# Patient Record
Sex: Male | Born: 1965 | Race: White | Hispanic: No | Marital: Single | Smoking: Current every day smoker
Health system: Southern US, Community
[De-identification: ages and names within clinical notes are randomized; demographics above are authoritative.]

## PROBLEM LIST (undated history)

## (undated) DIAGNOSIS — K759 Inflammatory liver disease, unspecified: Secondary | ICD-10-CM

## (undated) DIAGNOSIS — I214 Non-ST elevation (NSTEMI) myocardial infarction: Secondary | ICD-10-CM

## (undated) DIAGNOSIS — I509 Heart failure, unspecified: Secondary | ICD-10-CM

## (undated) DIAGNOSIS — I219 Acute myocardial infarction, unspecified: Secondary | ICD-10-CM

## (undated) DIAGNOSIS — I10 Essential (primary) hypertension: Secondary | ICD-10-CM

## (undated) DIAGNOSIS — C801 Malignant (primary) neoplasm, unspecified: Secondary | ICD-10-CM

## (undated) DIAGNOSIS — K2270 Barrett's esophagus without dysplasia: Secondary | ICD-10-CM

## (undated) DIAGNOSIS — J449 Chronic obstructive pulmonary disease, unspecified: Secondary | ICD-10-CM

## (undated) DIAGNOSIS — I2510 Atherosclerotic heart disease of native coronary artery without angina pectoris: Secondary | ICD-10-CM

## (undated) HISTORY — PX: LIVER BIOPSY: SHX301

## (undated) HISTORY — PX: CARDIAC CATHETERIZATION: SHX172

## (undated) HISTORY — PX: CORONARY STENT INTERVENTION: CATH118234

## (undated) HISTORY — PX: APPENDECTOMY: SHX54

## (undated) HISTORY — PX: CORONARY STENT PLACEMENT: SHX1402

---

## 2003-01-10 ENCOUNTER — Encounter: Payer: Self-pay | Admitting: Emergency Medicine

## 2003-01-10 ENCOUNTER — Emergency Department (HOSPITAL_COMMUNITY): Admission: EM | Admit: 2003-01-10 | Discharge: 2003-01-10 | Payer: Self-pay | Admitting: Emergency Medicine

## 2004-12-04 ENCOUNTER — Emergency Department (HOSPITAL_COMMUNITY): Admission: EM | Admit: 2004-12-04 | Discharge: 2004-12-04 | Payer: Self-pay | Admitting: Emergency Medicine

## 2010-07-12 ENCOUNTER — Ambulatory Visit: Payer: Self-pay | Admitting: Psychiatry

## 2010-07-13 ENCOUNTER — Inpatient Hospital Stay (HOSPITAL_COMMUNITY): Admission: EM | Admit: 2010-07-13 | Discharge: 2010-07-15 | Payer: Self-pay | Admitting: Psychiatry

## 2010-08-16 ENCOUNTER — Encounter
Admission: RE | Admit: 2010-08-16 | Discharge: 2010-09-12 | Payer: Self-pay | Source: Home / Self Care | Attending: Otolaryngology | Admitting: Otolaryngology

## 2010-11-28 LAB — FOLATE RBC: RBC Folate: 668 ng/mL — ABNORMAL HIGH (ref 180–600)

## 2010-11-28 LAB — TSH: TSH: 5.527 u[IU]/mL — ABNORMAL HIGH (ref 0.350–4.500)

## 2012-09-29 ENCOUNTER — Emergency Department (HOSPITAL_COMMUNITY): Payer: Medicaid Other

## 2012-09-29 ENCOUNTER — Inpatient Hospital Stay (HOSPITAL_COMMUNITY)
Admission: EM | Admit: 2012-09-29 | Discharge: 2012-10-04 | DRG: 194 | Disposition: A | Discharge status: Home / Self Care | Payer: Medicaid Other | Attending: Internal Medicine | Admitting: Internal Medicine

## 2012-09-29 ENCOUNTER — Encounter (HOSPITAL_COMMUNITY): Payer: Self-pay | Admitting: Emergency Medicine

## 2012-09-29 DIAGNOSIS — J449 Chronic obstructive pulmonary disease, unspecified: Secondary | ICD-10-CM

## 2012-09-29 DIAGNOSIS — I428 Other cardiomyopathies: Secondary | ICD-10-CM

## 2012-09-29 DIAGNOSIS — R000 Tachycardia, unspecified: Secondary | ICD-10-CM | POA: Diagnosis present

## 2012-09-29 DIAGNOSIS — J111 Influenza due to unidentified influenza virus with other respiratory manifestations: Secondary | ICD-10-CM

## 2012-09-29 DIAGNOSIS — R0602 Shortness of breath: Secondary | ICD-10-CM

## 2012-09-29 DIAGNOSIS — J4489 Other specified chronic obstructive pulmonary disease: Secondary | ICD-10-CM | POA: Diagnosis present

## 2012-09-29 DIAGNOSIS — E860 Dehydration: Secondary | ICD-10-CM

## 2012-09-29 DIAGNOSIS — Z8619 Personal history of other infectious and parasitic diseases: Secondary | ICD-10-CM | POA: Diagnosis present

## 2012-09-29 DIAGNOSIS — D751 Secondary polycythemia: Secondary | ICD-10-CM | POA: Diagnosis present

## 2012-09-29 DIAGNOSIS — R509 Fever, unspecified: Secondary | ICD-10-CM

## 2012-09-29 DIAGNOSIS — I429 Cardiomyopathy, unspecified: Secondary | ICD-10-CM

## 2012-09-29 DIAGNOSIS — J101 Influenza due to other identified influenza virus with other respiratory manifestations: Principal | ICD-10-CM

## 2012-09-29 DIAGNOSIS — R079 Chest pain, unspecified: Secondary | ICD-10-CM

## 2012-09-29 DIAGNOSIS — D45 Polycythemia vera: Secondary | ICD-10-CM | POA: Diagnosis present

## 2012-09-29 DIAGNOSIS — B1920 Unspecified viral hepatitis C without hepatic coma: Secondary | ICD-10-CM | POA: Diagnosis present

## 2012-09-29 DIAGNOSIS — I498 Other specified cardiac arrhythmias: Secondary | ICD-10-CM | POA: Diagnosis present

## 2012-09-29 DIAGNOSIS — Z87891 Personal history of nicotine dependence: Secondary | ICD-10-CM

## 2012-09-29 HISTORY — DX: Chronic obstructive pulmonary disease, unspecified: J44.9

## 2012-09-29 MED ORDER — SODIUM CHLORIDE 0.9 % IV BOLUS (SEPSIS)
1000.0000 mL | Freq: Once | INTRAVENOUS | Status: AC
  Administered 2012-09-30: 1000 mL via INTRAVENOUS

## 2012-09-29 MED ORDER — ACETAMINOPHEN 325 MG PO TABS
650.0000 mg | ORAL_TABLET | Freq: Once | ORAL | Status: AC
  Administered 2012-09-30: 650 mg via ORAL
  Filled 2012-09-29: qty 2

## 2012-09-29 NOTE — ED Notes (Signed)
Bed:WA04<BR> Expected date:<BR> Expected time:<BR> Means of arrival:<BR> Comments:<BR> Hold for triage

## 2012-09-29 NOTE — ED Notes (Signed)
Pt alert, arrives from home, c/o cough, sob, fever, onset was last weekend, resp even unlabored, skin pwd

## 2012-09-30 ENCOUNTER — Inpatient Hospital Stay (HOSPITAL_COMMUNITY): Payer: Medicaid Other

## 2012-09-30 ENCOUNTER — Encounter (HOSPITAL_COMMUNITY): Payer: Self-pay | Admitting: Internal Medicine

## 2012-09-30 DIAGNOSIS — Z8619 Personal history of other infectious and parasitic diseases: Secondary | ICD-10-CM | POA: Diagnosis present

## 2012-09-30 DIAGNOSIS — R509 Fever, unspecified: Secondary | ICD-10-CM | POA: Diagnosis present

## 2012-09-30 DIAGNOSIS — J111 Influenza due to unidentified influenza virus with other respiratory manifestations: Secondary | ICD-10-CM

## 2012-09-30 DIAGNOSIS — J101 Influenza due to other identified influenza virus with other respiratory manifestations: Secondary | ICD-10-CM | POA: Diagnosis present

## 2012-09-30 DIAGNOSIS — I428 Other cardiomyopathies: Secondary | ICD-10-CM

## 2012-09-30 DIAGNOSIS — E860 Dehydration: Secondary | ICD-10-CM

## 2012-09-30 DIAGNOSIS — I429 Cardiomyopathy, unspecified: Secondary | ICD-10-CM | POA: Diagnosis present

## 2012-09-30 DIAGNOSIS — R0602 Shortness of breath: Secondary | ICD-10-CM

## 2012-09-30 LAB — URINALYSIS, ROUTINE W REFLEX MICROSCOPIC
Hgb urine dipstick: NEGATIVE
Leukocytes, UA: NEGATIVE
Nitrite: NEGATIVE
Specific Gravity, Urine: 1.025 (ref 1.005–1.030)
Urobilinogen, UA: 1 mg/dL (ref 0.0–1.0)

## 2012-09-30 LAB — BASIC METABOLIC PANEL
BUN: 13 mg/dL (ref 6–23)
CO2: 23 mEq/L (ref 19–32)
Chloride: 100 mEq/L (ref 96–112)
Creatinine, Ser: 1.01 mg/dL (ref 0.50–1.35)
GFR calc Af Amer: >90 mL/min (ref >90)
Glucose, Bld: 115 mg/dL — ABNORMAL HIGH (ref 70–99)

## 2012-09-30 LAB — CBC WITH DIFFERENTIAL/PLATELET
Lymphocytes Relative: 18 % (ref 12–46)
Lymphs Abs: 1.3 10*3/uL (ref 0.7–4.0)
Neutro Abs: 4.4 10*3/uL (ref 1.7–7.7)
Neutrophils Relative %: 62 % (ref 43–77)
Platelets: 205 10*3/uL (ref 150–400)
RBC: 5.38 MIL/uL (ref 4.22–5.81)
WBC: 7 10*3/uL (ref 4.0–10.5)

## 2012-09-30 LAB — COMPREHENSIVE METABOLIC PANEL
AST: 26 U/L (ref 0–37)
CO2: 24 mEq/L (ref 19–32)
Calcium: 9.3 mg/dL (ref 8.4–10.5)
Creatinine, Ser: 1.07 mg/dL (ref 0.50–1.35)
GFR calc non Af Amer: 81 mL/min — ABNORMAL LOW (ref >90)

## 2012-09-30 LAB — CBC
HCT: 46.8 % (ref 39.0–52.0)
MCH: 33.3 pg (ref 26.0–34.0)
MCHC: 34.4 g/dL (ref 30.0–36.0)
MCV: 96.9 fL (ref 78.0–100.0)
RDW: 13.6 % (ref 11.5–15.5)

## 2012-09-30 LAB — D-DIMER, QUANTITATIVE: D-Dimer, Quant: 0.98 ug/mL-FEU — ABNORMAL HIGH (ref 0.00–0.48)

## 2012-09-30 LAB — PRO B NATRIURETIC PEPTIDE: Pro B Natriuretic peptide (BNP): 478.1 pg/mL — ABNORMAL HIGH (ref 0–125)

## 2012-09-30 LAB — INFLUENZA PANEL BY PCR (TYPE A & B)
Influenza A By PCR: POSITIVE — AB
Influenza B By PCR: NEGATIVE

## 2012-09-30 LAB — TROPONIN I
Troponin I: <0.3 ng/mL (ref <0.30)
Troponin I: <0.3 ng/mL (ref <0.30)

## 2012-09-30 LAB — PROCALCITONIN: Procalcitonin: <0.1 ng/mL

## 2012-09-30 MED ORDER — SODIUM CHLORIDE 0.9 % IJ SOLN
3.0000 mL | Freq: Two times a day (BID) | INTRAMUSCULAR | Status: DC
  Administered 2012-10-01 – 2012-10-03 (×6): 3 mL via INTRAVENOUS

## 2012-09-30 MED ORDER — IBUPROFEN 800 MG PO TABS
800.0000 mg | ORAL_TABLET | Freq: Once | ORAL | Status: AC
  Administered 2012-09-30: 800 mg via ORAL
  Filled 2012-09-30: qty 1

## 2012-09-30 MED ORDER — ACETAMINOPHEN 325 MG PO TABS
650.0000 mg | ORAL_TABLET | Freq: Four times a day (QID) | ORAL | Status: DC | PRN
  Administered 2012-09-30 – 2012-10-01 (×2): 650 mg via ORAL
  Filled 2012-09-30 (×2): qty 2

## 2012-09-30 MED ORDER — ENOXAPARIN SODIUM 40 MG/0.4ML ~~LOC~~ SOLN
40.0000 mg | SUBCUTANEOUS | Status: DC
  Administered 2012-09-30 – 2012-10-03 (×4): 40 mg via SUBCUTANEOUS
  Filled 2012-09-30 (×5): qty 0.4

## 2012-09-30 MED ORDER — NITROGLYCERIN 0.4 MG SL SUBL: 0.4000 mg | SUBLINGUAL_TABLET | SUBLINGUAL | Status: DC | PRN

## 2012-09-30 MED ORDER — ACETAMINOPHEN 650 MG RE SUPP: 650.0000 mg | Freq: Four times a day (QID) | RECTAL | Status: DC | PRN

## 2012-09-30 MED ORDER — INFLUENZA VIRUS VACC SPLIT PF IM SUSP
0.5000 mL | Freq: Once | INTRAMUSCULAR | Status: AC
  Administered 2012-09-30: 0.5 mL via INTRAMUSCULAR
  Filled 2012-09-30: qty 0.5

## 2012-09-30 MED ORDER — SODIUM CHLORIDE 0.9 % IV SOLN
INTRAVENOUS | Status: AC
  Administered 2012-09-30 (×3): via INTRAVENOUS

## 2012-09-30 MED ORDER — ONDANSETRON HCL 4 MG PO TABS: 4.0000 mg | ORAL_TABLET | Freq: Four times a day (QID) | ORAL | Status: DC | PRN

## 2012-09-30 MED ORDER — SODIUM CHLORIDE 0.9 % IV SOLN
Freq: Once | INTRAVENOUS | Status: AC
  Administered 2012-09-30: 01:00:00 via INTRAVENOUS

## 2012-09-30 MED ORDER — LEVOFLOXACIN IN D5W 750 MG/150ML IV SOLN
750.0000 mg | Freq: Every day | INTRAVENOUS | Status: DC
  Administered 2012-09-30 – 2012-10-01 (×2): 750 mg via INTRAVENOUS
  Filled 2012-09-30 (×2): qty 150

## 2012-09-30 MED ORDER — OSELTAMIVIR PHOSPHATE 75 MG PO CAPS
75.0000 mg | ORAL_CAPSULE | Freq: Two times a day (BID) | ORAL | Status: AC
  Administered 2012-09-30 – 2012-10-04 (×10): 75 mg via ORAL
  Filled 2012-09-30 (×12): qty 1

## 2012-09-30 MED ORDER — IOHEXOL 350 MG/ML SOLN
100.0000 mL | Freq: Once | INTRAVENOUS | Status: AC | PRN
  Administered 2012-09-30: 100 mL via INTRAVENOUS

## 2012-09-30 MED ORDER — CARVEDILOL 3.125 MG PO TABS
3.1250 mg | ORAL_TABLET | Freq: Two times a day (BID) | ORAL | Status: DC
  Administered 2012-09-30 – 2012-10-04 (×8): 3.125 mg via ORAL
  Filled 2012-09-30 (×11): qty 1

## 2012-09-30 MED ORDER — ASPIRIN EC 81 MG PO TBEC
81.0000 mg | DELAYED_RELEASE_TABLET | Freq: Every day | ORAL | Status: DC
  Administered 2012-09-30 – 2012-10-04 (×5): 81 mg via ORAL
  Filled 2012-09-30 (×5): qty 1

## 2012-09-30 MED ORDER — ONDANSETRON HCL 4 MG/2ML IJ SOLN: 4.0000 mg | Freq: Four times a day (QID) | INTRAMUSCULAR | Status: DC | PRN

## 2012-09-30 MED ORDER — PNEUMOCOCCAL VAC POLYVALENT 25 MCG/0.5ML IJ INJ
0.5000 mL | INJECTION | Freq: Once | INTRAMUSCULAR | Status: AC
  Administered 2012-09-30: 0.5 mL via INTRAMUSCULAR
  Filled 2012-09-30: qty 0.5

## 2012-09-30 NOTE — Progress Notes (Signed)
INITIAL NUTRITION ASSESSMENT  DOCUMENTATION CODES Per approved criteria  -Not Applicable   INTERVENTION: 1.  Meals/snacks; as requested from nourishment room. 2.  Modify diet; pt may benefit from diet liberalization to Regular if appropriate per MD.    NUTRITION DIAGNOSIS: Inadequate oral intake related to weakness, poor appetite as evidenced by pt report.   Monitor:  1.  Food/Beverage; improvement in intake to >50% of meals.  Reason for Assessment: MST  47 y.o. male  Admitting Dx: SOB (shortness of breath)  ASSESSMENT: Pt admitted with fever and shortness of breath found to have flu.  Pt with h/o cardiomyopathy. Pt states that over the past month he has noted wt change of unknown amount.  States his clothes were fitting differently.  Pt reports he has been working a lot and typically gets a ham and egg biscuit on his way to work every day.  He typically does not eat anything else because he is a poor cook and cannot keep snacks at home.   Pt states he has not been able to eat anything in the past 3-4 days due to illness.  Was able to eat 25% of meal this afternoon- broth and few bites of chicken.  Agreeable to snack when offered. RD discussed inadequacy of intake at home PTA. Pt states agreement that he has not been eating enough. RD discussed need to keep easy to prepare meals or snacks at home due to pt's lack of cooking.  Will follow for ongoing education needs.  Height: Ht Readings from Last 1 Encounters:  09/30/12 5\' 9"  (1.753 m)    Weight: Wt Readings from Last 1 Encounters:  09/30/12 135 lb 2.3 oz (61.3 kg)    Ideal Body Weight: 72.7  % Ideal Body Weight: 84%  Wt Readings from Last 10 Encounters:  09/30/12 135 lb 2.3 oz (61.3 kg)    Usual Body Weight: pt does not know  BMI:  Body mass index is 19.96 kg/(m^2).  Estimated Nutritional Needs: Kcal: 1700-1850 Protein: 61-73g Fluid: >1.8 L/day  Skin: wnl  Diet Order: Cardiac  EDUCATION NEEDS: -Education  needs addressed   Intake/Output Summary (Last 24 hours) at 09/30/12 1606 Last data filed at 09/30/12 1500  Gross per 24 hour  Intake   1395 ml  Output    300 ml  Net   1095 ml    Last BM: PTA   Labs:   Lab 09/30/12 0530 09/30/12 0003  NA 133* 132*  K 3.5 3.7  CL 100 94*  CO2 23 24  BUN 13 11  CREATININE 1.01 1.07  CALCIUM 8.4 9.3  MG -- --  PHOS -- --  GLUCOSE 115* 89    CBG (last 3)  No results found for this basename: GLUCAP:3 in the last 72 hours  Scheduled Meds:   . aspirin EC  81 mg Oral Daily  . carvedilol  3.125 mg Oral BID WC  . enoxaparin (LOVENOX) injection  40 mg Subcutaneous Q24H  . levofloxacin (LEVAQUIN) IV  750 mg Intravenous Q0600  . oseltamivir  75 mg Oral BID  . sodium chloride  3 mL Intravenous Q12H    Continuous Infusions:   . sodium chloride 100 mL/hr at 09/30/12 1232    Past Medical History  Diagnosis Date  . COPD (chronic obstructive pulmonary disease)     Past Surgical History  Procedure Date  . Appendectomy   . Liver biopsy   . Cardiac catheterization     Loyce Dys, MS RD LDN Clinical  Inpatient Dietitian Pager: [phone] Weekend/After hours pager: [phone]

## 2012-09-30 NOTE — ED Notes (Signed)
Went into room to check on patient. Patient was sweating and hot to touch. Took rectal temp on patient 101.8. Reported findings to [employer]. Patient is on the cardiac monitor and VS are cycling.

## 2012-09-30 NOTE — ED Notes (Signed)
Pt transported to floor via stretcher on monitor condition remains stable

## 2012-09-30 NOTE — H&P (Signed)
Christopher Meyers is an 47 y.o. male.   Patient was seen and examined on September 30, 2012. PCP - none. Chief Complaint: Fever and shortness of breath. HPI: 47 year old male with history of nonischemic cardiomyopathy has had cardiac catheter last year April in [facility] which was showing normal coronaries presents with complaints of worsening shortness of breath fever chills nausea vomiting and fatigue. Patient has been having these symptoms for last 3-4 days. Patient has been having productive cough for the same number of days. In the ER patient was found to be tachycardic and febrile. Chest x-ray does not show any acute UA is unremarkable. Patient at this time has been admitted for fever most likely from viral causes.   Past Medical History  Diagnosis Date  . COPD (chronic obstructive pulmonary disease)     Past Surgical History  Procedure Date  . Appendectomy   . Liver biopsy   . Cardiac catheterization     Family History  Problem Relation Age of Onset  . CAD Father   . Asthma Sister    Social History:  reports that he has quit smoking. He does not have any smokeless tobacco history on file. He reports that he does not drink alcohol. His drug history not on file.  Allergies: No Known Allergies   (Not in a hospital admission)  Results for orders placed during the hospital encounter of 09/29/12 (from the past 48 hour(s))  CBC WITH DIFFERENTIAL     Status: Abnormal   Collection Time   09/30/12 12:03 AM      Component Value Range Comment   WBC 7.0  4.0 - 10.5 K/uL    RBC 5.38  4.22 - 5.81 MIL/uL    Hemoglobin 18.2 (*) 13.0 - 17.0 g/dL    HCT 16.1 (*) 09.6 - 52.0 %    MCV 97.0  78.0 - 100.0 fL    MCH 33.8  26.0 - 34.0 pg    MCHC 34.9  30.0 - 36.0 g/dL    RDW 04.5  40.9 - 81.1 %    Platelets 205  150 - 400 K/uL    Neutrophils Relative 62  43 - 77 %    Neutro Abs 4.4  1.7 - 7.7 K/uL    Lymphocytes Relative 18  12 - 46 %    Lymphs Abs 1.3  0.7 - 4.0 K/uL      Monocytes Relative 19 (*) 3 - 12 %    Monocytes Absolute 1.4 (*) 0.1 - 1.0 K/uL    Eosinophils Relative 0  0 - 5 %    Eosinophils Absolute 0.0  0.0 - 0.7 K/uL    Basophils Relative 0  0 - 1 %    Basophils Absolute 0.0  0.0 - 0.1 K/uL   TROPONIN I     Status: Normal   Collection Time   09/30/12 12:03 AM      Component Value Range Comment   Troponin I <0.30  <0.30 ng/mL   PRO B NATRIURETIC PEPTIDE     Status: Abnormal   Collection Time   09/30/12 12:03 AM      Component Value Range Comment   Pro B Natriuretic peptide (BNP) 478.1 (*) 0 - 125 pg/mL   COMPREHENSIVE METABOLIC PANEL     Status: Abnormal   Collection Time   09/30/12 12:03 AM      Component Value Range Comment   Sodium 132 (*) 135 - 145 mEq/L    Potassium 3.7  3.5 - 5.1 mEq/L    Chloride 94 (*) 96 - 112 mEq/L    CO2 24  19 - 32 mEq/L    Glucose, Bld 89  70 - 99 mg/dL    BUN 11  6 - 23 mg/dL    Creatinine, Ser 2.72  0.50 - 1.35 mg/dL    Calcium 9.3  8.4 - 53.6 mg/dL    Total Protein 7.9  6.0 - 8.3 g/dL    Albumin 3.8  3.5 - 5.2 g/dL    AST 26  0 - 37 U/L    ALT 10  0 - 53 U/L    Alkaline Phosphatase 61  39 - 117 U/L    Total Bilirubin 0.3  0.3 - 1.2 mg/dL    GFR calc non Af Amer 81 (*) >90 mL/min    GFR calc Af Amer >90  >90 mL/min   URINALYSIS, ROUTINE W REFLEX MICROSCOPIC     Status: Abnormal   Collection Time   09/30/12  1:16 AM      Component Value Range Comment   Color, Urine YELLOW  YELLOW    APPearance CLEAR  CLEAR    Specific Gravity, Urine 1.025  1.005 - 1.030    pH 5.5  5.0 - 8.0    Glucose, UA NEGATIVE  NEGATIVE mg/dL    Hgb urine dipstick NEGATIVE  NEGATIVE    Bilirubin Urine SMALL (*) NEGATIVE    Ketones, ur TRACE (*) NEGATIVE mg/dL    Protein, ur NEGATIVE  NEGATIVE mg/dL    Urobilinogen, UA 1.0  0.0 - 1.0 mg/dL    Nitrite NEGATIVE  NEGATIVE    Leukocytes, UA NEGATIVE  NEGATIVE MICROSCOPIC NOT DONE ON URINES WITH NEGATIVE PROTEIN, BLOOD, LEUKOCYTES, NITRITE, OR GLUCOSE <1000 mg/dL.   Dg Chest 2  View  09/30/2012  *RADIOLOGY REPORT*  Clinical Data: Chest pain, fever.  CHEST - 2 VIEW  Comparison: 05/20/2011  Findings: Hyperinflation.  Minimal spurring in the mid thoracic spine. Lungs clear.  Heart size and pulmonary vascularity normal. No effusion.  IMPRESSION: No acute disease   Original Report Authenticated By: D. Andria Rhein, MD     Review of Systems  Constitutional: Positive for fever, chills and malaise/fatigue.  HENT: Negative.   Eyes: Negative.   Respiratory: Positive for cough, sputum production and shortness of breath.   Cardiovascular: Negative.   Gastrointestinal: Positive for nausea and vomiting.  Genitourinary: Negative.   Musculoskeletal: Negative.   Skin: Negative.   Neurological: Positive for weakness.  Endo/Heme/Allergies: Negative.   Psychiatric/Behavioral: Negative.     Blood pressure 127/82, pulse 113, temperature 101.8 F (38.8 C), temperature source Rectal, resp. rate 23, weight 65.772 kg (145 lb), SpO2 97.00%. Physical Exam  Constitutional: He is oriented to person, place, and time. He appears well-developed and well-nourished. No distress.  HENT:  Head: Normocephalic and atraumatic.  Right Ear: External ear normal.  Left Ear: External ear normal.  Nose: Nose normal.  Mouth/Throat: Oropharynx is clear and moist. No oropharyngeal exudate.  Eyes: Conjunctivae normal are normal. Pupils are equal, round, and reactive to light. Right eye exhibits no discharge. Left eye exhibits no discharge. No scleral icterus.  Neck: Normal range of motion. Neck supple.  Cardiovascular: Regular rhythm.        Tachycardia.  Respiratory: Effort normal and breath sounds normal. No respiratory distress. He has no wheezes. He has no rales.  GI: Soft. Bowel sounds are normal. He exhibits no distension. There is no tenderness. There is no rebound.  Musculoskeletal: He exhibits no edema and no tenderness.  Neurological: He is alert and oriented to person, place, and time.        Moves all extremities.  Skin: Skin is warm and dry. He is not diaphoretic.     Assessment/Plan #1. Shortness of breath with fever nausea and vomiting - patient's symptoms are consistent with possible viral infection. Blood cultures procalcitonin levels and flu PCR has been ordered. Patient has been empirically started on Tamiflu and also at this time until cultures are available patient will be on Levaquin empirically for possible pneumonia. #2. Dehydration from nausea and vomiting - patient at this time will be placed on IV fluids. Patient does have history of cardiomyopathy but presently is dehydrated so we will gently hydrate. #3. History of nonischemic cardiomyopathy - patient was not taking his medications for last 2 months. Patient has been placed on Coreg again. Spironolactone is on hold due to the dehydration. #4. Polycythemia probably from dehydration and COPD - recheck CBC in a.m. #5. Recently quit smoking. 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  Zaakirah Kistner N. 09/30/2012, 2:54 AM

## 2012-09-30 NOTE — ED Notes (Signed)
Patient has been given urinal and is aware that we need a urine sample.

## 2012-09-30 NOTE — Progress Notes (Signed)
ANTIBIOTIC CONSULT NOTE - INITIAL  Pharmacy Consult for Levofloxacin Indication: pneumonia  No Known Allergies  Patient Measurements: Height: 5\' 9"  (175.3 cm) Weight: 135 lb 2.3 oz (61.3 kg) IBW/kg (Calculated) : 70.7  Adjusted Body Weight:   Vital Signs: Temp: 98 F (36.7 C) (01/15 0423) Temp src: Oral (01/15 0423) BP: 115/75 mmHg (01/15 0423) Pulse Rate: 87  (01/15 0423) Intake/Output from previous day: 01/14 0701 - 01/15 0700 In: -  Out: 300 [Urine:300] Intake/Output from this shift: Total I/O In: -  Out: 300 [Urine:300]  Labs:  [facility] 09/30/12 0003  WBC 7.0  HGB 18.2*  PLT 205  LABCREA --  CREATININE 1.07   Estimated Creatinine Clearance: 74 ml/min (by C-G formula based on Cr of 1.07). No results found for this basename: VANCOTROUGH:2,VANCOPEAK:2,VANCORANDOM:2,GENTTROUGH:2,GENTPEAK:2,GENTRANDOM:2,TOBRATROUGH:2,TOBRAPEAK:2,TOBRARND:2,AMIKACINPEAK:2,AMIKACINTROU:2,AMIKACIN:2, in the last 72 hours   Microbiology: No results found for this or any previous visit (from the past 720 hour(s)).  Medical History: Past Medical History  Diagnosis Date  . COPD (chronic obstructive pulmonary disease)     Medications:  Anti-infectives     Start     Dose/Rate Route Frequency Ordered Stop   09/30/12 0445   levofloxacin (LEVAQUIN) IVPB 750 mg        750 mg 100 mL/hr over 90 Minutes Intravenous Daily 09/30/12 0432     09/30/12 0200   oseltamivir (TAMIFLU) capsule 75 mg        75 mg Oral 2 times daily 09/30/12 0156 10/04/12 2159         Assessment: Patient with PNA.  Goal of Therapy:  Levofloxacin dosed based on patient weight and renal function  Plan:  Follow up culture results Levofloxacin 750mg  iv q24hr       Aleene Davidson Crowford 09/30/2012,4:33 AM

## 2012-09-30 NOTE — Care Management Note (Unsigned)
    Page 1 of 1   09/30/2012     11:12:46 AM   CARE MANAGEMENT NOTE 09/30/2012  Patient:  Christopher Meyers, Christopher Meyers   Account Number:  [id number]  Date Initiated:  09/30/2012  Documentation initiated by:  Lanier Clam  Subjective/Objective Assessment:   ADMITTED W/SOB.     Action/Plan:   FROM HOME.   Anticipated DC Date:  10/05/2012   Anticipated DC Plan:  HOME/SELF CARE      DC Planning Services  CM consult      Choice offered to / List presented to:             Status of service:  In process, will continue to follow Medicare Important Message given?   (If response is "NO", the following Medicare IM given date fields will be blank) Date Medicare IM given:   Date Additional Medicare IM given:    Discharge Disposition:    Per UR Regulation:  Reviewed for med. necessity/level of care/duration of stay  If discussed at Long Length of Stay Meetings, dates discussed:    Comments:  09/30/12 [facility] Basil Buffin RN,BSN [phone] [phone]

## 2012-09-30 NOTE — ED Notes (Signed)
Pt family members Kailo Kosik. [phone] and Sharlene Dory [phone] request to be called if conditions or need for family intervention with care. Also sister Rebbeca Paul request contacted if medical consent needed at [phone]. All family members state patient is a full code and request that everything be done.

## 2012-09-30 NOTE — ED Notes (Signed)
Report called to [facility] RN [phone] bed is ready

## 2012-09-30 NOTE — ED Provider Notes (Signed)
History     CSN: [id number]  Arrival date & time 09/29/12  2013   First MD Initiated Contact with Patient 09/29/12 2305      Chief Complaint  Patient presents with  . Shortness of Breath    (Consider location/radiation/quality/duration/timing/severity/associated sxs/prior treatment) HPI 47 year old male presents emergency department via EMS with complaint of 3 days of fever, chills, cough, posttussive emesis along with nausea and vomiting, generalized weakness, and chest pain. Patient reports history of COPD and "weak heart" diagnosed at [facility] year ago. He reports this was diagnosed after a chest x-ray and a heart catheterization. He reports he was put on "heart meds", and reports he's been out of them for the last 2-3 months since his girlfriend left with all of his stuff. He did not get a flu shot this year. Patient has required his 47 year old son to help him around the house due to his weakness over the last 24-48 hours.  No PO intake for 24 hours+  Past Medical History  Diagnosis Date  . COPD (chronic obstructive pulmonary disease)     History reviewed. No pertinent past surgical history.  No family history on file.  History  Substance Use Topics  . Smoking status: Never Smoker   . Smokeless tobacco: Not on file  . Alcohol Use: No      Review of Systems  Constitutional: Positive for fever, chills, appetite change and fatigue.  Respiratory: Positive for cough, chest tightness and shortness of breath.   Cardiovascular: Positive for chest pain.  Gastrointestinal: Positive for nausea and vomiting.  Musculoskeletal: Positive for myalgias and arthralgias.    Allergies  Review of patient's allergies indicates no known allergies.  Home Medications   Current Outpatient Rx  Name  Route  Sig  Dispense  Refill  . ANTIPYRINE-BENZOCAINE 5.4-1.4 % OT SOLN   Right Ear   Place 2-4 drops into the right ear 4 (four) times daily as needed. Impacted cerumen           . CARVEDILOL 3.125 MG PO TABS   Oral   Take 3.125 mg by mouth 2 (two) times daily with a meal.         . CEPHALEXIN 500 MG PO CAPS   Oral   Take 500 mg by mouth 3 (three) times daily.         Marland Kitchen NITROGLYCERIN 0.4 MG SL SUBL   Sublingual   Place 0.4 mg under the tongue every 5 (five) minutes as needed. Chest pain         . SPIRONOLACTONE 25 MG PO TABS   Oral   Take 25 mg by mouth daily.           BP 123/94  Pulse 128  Temp 100 F (37.8 C) (Oral)  Resp 16  Wt 145 lb (65.772 kg)  SpO2 93%  Physical Exam  Constitutional: He is oriented to person, place, and time. He appears distressed.       Thin male, chronically ill-appearing, older than stated age  HENT:  Head: Normocephalic and atraumatic.       Dry mucous membranes  Neck: Normal range of motion. Neck supple. No JVD present. No tracheal deviation present. No thyromegaly present.  Cardiovascular: Normal heart sounds and intact distal pulses.  Exam reveals no gallop and no friction rub.   No murmur heard.      Tachycardia noted  Pulmonary/Chest: Breath sounds normal. No stridor. He has no wheezes. He has no rales. He exhibits  no tenderness.       Tachypnea noted  Abdominal: Soft. Bowel sounds are normal. He exhibits no distension and no mass. There is no tenderness. There is no rebound and no guarding.  Musculoskeletal: Normal range of motion. He exhibits no edema and no tenderness.  Lymphadenopathy:    He has no cervical adenopathy.  Neurological: He is alert and oriented to person, place, and time.  Skin: No rash noted. He is diaphoretic. No erythema. There is pallor.    ED Course  Procedures (including critical care time)  Labs Reviewed  CBC WITH DIFFERENTIAL - Abnormal; Notable for the following:    Hemoglobin 18.2 (*)     HCT 52.2 (*)     Monocytes Relative 19 (*)     Monocytes Absolute 1.4 (*)     All other components within normal limits  PRO B NATRIURETIC PEPTIDE - Abnormal; Notable for the  following:    Pro B Natriuretic peptide (BNP) 478.1 (*)     All other components within normal limits  COMPREHENSIVE METABOLIC PANEL - Abnormal; Notable for the following:    Sodium 132 (*)     Chloride 94 (*)     GFR calc non Af Amer 81 (*)     All other components within normal limits  URINALYSIS, ROUTINE W REFLEX MICROSCOPIC - Abnormal; Notable for the following:    Bilirubin Urine SMALL (*)     Ketones, ur TRACE (*)     All other components within normal limits  D-DIMER, QUANTITATIVE - Abnormal; Notable for the following:    D-Dimer, Quant 0.98 (*)     All other components within normal limits  TROPONIN I  INFLUENZA PANEL BY PCR  TROPONIN I  TROPONIN I  CULTURE, BLOOD (ROUTINE X 2)  CULTURE, BLOOD (ROUTINE X 2)  PROCALCITONIN   Dg Chest 2 View  09/30/2012  *RADIOLOGY REPORT*  Clinical Data: Chest pain, fever.  CHEST - 2 VIEW  Comparison: 05/20/2011  Findings: Hyperinflation.  Minimal spurring in the mid thoracic spine. Lungs clear.  Heart size and pulmonary vascularity normal. No effusion.  IMPRESSION: No acute disease   Original Report Authenticated By: D. Andria Rhein, MD     Date: 09/30/2012  Rate: 124  Rhythm: sinus tachycardia  QRS Axis: normal  Intervals: normal  ST/T Wave abnormalities: normal  Conduction Disutrbances:none  Narrative Interpretation:   Old EKG Reviewed: none available   1. Influenza-like illness   2. Chest pain   3. Nonischemic cardiomyopathy   4. Cardiomyopathy   5. Dehydration   6. Fever   7. SOB (shortness of breath)       MDM  47 year old male probable flu. He gives a history of weak heart. I am sure this is current off of the or congestive heart failure. Patient also carries diagnosis of COPD. He has a high risk given probable flu. Will rehydrate check labs. He may need hospitalization.      Previous records received, show EF of 35%.  Normal cath, thought to be nonischemic cardiomyopathy.  Pt with persistent tachycardia, fever.   Will d/w hospitalist for admission given his comorbidities  Olivia Mackie, MD 09/30/12 [phone]

## 2012-09-30 NOTE — Progress Notes (Signed)
I have seen and assessed the patient and I grew Dr. Katherene Ponto assessment and plan. Initial d-dimer was elevated and as such a CT angiogram was ordered and is negative for any acute PE. Continue current treatment with Tamiflu and Levaquin empirically. Follow. Supportive care.

## 2012-10-01 DIAGNOSIS — D751 Secondary polycythemia: Secondary | ICD-10-CM | POA: Diagnosis present

## 2012-10-01 DIAGNOSIS — R000 Tachycardia, unspecified: Secondary | ICD-10-CM | POA: Diagnosis present

## 2012-10-01 DIAGNOSIS — J449 Chronic obstructive pulmonary disease, unspecified: Secondary | ICD-10-CM | POA: Diagnosis present

## 2012-10-01 LAB — BASIC METABOLIC PANEL
Calcium: 8.4 mg/dL (ref 8.4–10.5)
Chloride: 101 mEq/L (ref 96–112)
Creatinine, Ser: 0.92 mg/dL (ref 0.50–1.35)
GFR calc Af Amer: >90 mL/min (ref >90)

## 2012-10-01 LAB — CBC
MCV: 96.7 fL (ref 78.0–100.0)
Platelets: 166 10*3/uL (ref 150–400)
RDW: 13.1 % (ref 11.5–15.5)
WBC: 7.1 10*3/uL (ref 4.0–10.5)

## 2012-10-01 MED ORDER — LEVOFLOXACIN 500 MG PO TABS
500.0000 mg | ORAL_TABLET | Freq: Every day | ORAL | Status: DC
  Administered 2012-10-02 – 2012-10-04 (×3): 500 mg via ORAL
  Filled 2012-10-01 (×3): qty 1

## 2012-10-01 MED ORDER — ALBUTEROL SULFATE (5 MG/ML) 0.5% IN NEBU: 2.5000 mg | INHALATION_SOLUTION | RESPIRATORY_TRACT | Status: DC | PRN

## 2012-10-01 MED ORDER — IPRATROPIUM BROMIDE 0.02 % IN SOLN: 0.5000 mg | RESPIRATORY_TRACT | Status: DC | PRN

## 2012-10-01 NOTE — Evaluation (Signed)
Occupational Therapy Evaluation Patient Details Name: Christopher Meyers MRN: [id number] DOB: September 22, 1965 Today's Date: 10/01/2012 Time: 7829-5621 OT Time Calculation (min): 15 min  OT Assessment / Plan / Recommendation Clinical Impression  47 yo admitted with SOB, flu, H1N1. Feel pt is close to baseline functional status. Skilled OT indicated to maximize independence with BADLs to mod I level in prep for d/c home.    OT Assessment  Patient needs continued OT Services    Follow Up Recommendations  No OT follow up    Barriers to Discharge      Equipment Recommendations  3 in 1 bedside comode    Recommendations for Other Services    Frequency  Min 2X/week    Precautions / Restrictions Precautions Precautions: Fall   Pertinent Vitals/Pain Pt reported abdominal cramping. Repositioned for comfort.    ADL  Grooming: Teeth care;Brushing hair;Supervision/safety Where Assessed - Grooming: Unsupported standing Upper Body Bathing: Set up Where Assessed - Upper Body Bathing: Unsupported sitting Lower Body Bathing: Set up Where Assessed - Lower Body Bathing: Unsupported sit to stand Upper Body Dressing: Set up Where Assessed - Upper Body Dressing: Unsupported sitting Lower Body Dressing: Set up Where Assessed - Lower Body Dressing: Unsupported sit to stand Toilet Transfer: Supervision/safety Toilet Transfer Method: Sit to Barista: Comfort height toilet;Grab bars Toileting - Architect and Hygiene: Supervision/safety Where Assessed - Engineer, mining and Hygiene: Sit to stand from 3-in-1 or toilet Transfers/Ambulation Related to ADLs: Pt ambulated to the bathroom with supervision without the use of an AD. Fatigues quickly. ADL Comments: Pt struggled to rise from comfort height toilet without using grab bar and states that his toilet is very low. May benefit from use of 3n1.    OT Diagnosis: Generalized weakness  OT Problem List:  Decreased activity tolerance;Decreased knowledge of use of DME or AE;Decreased strength OT Treatment Interventions: Self-care/ADL training;Therapeutic activities;DME and/or AE instruction;Patient/family education   OT Goals Acute Rehab OT Goals OT Goal Formulation: With patient Time For Goal Achievement: 10/15/12 Potential to Achieve Goals: Good ADL Goals Pt Will Perform Grooming: with modified independence;Standing at sink ADL Goal: Grooming - Progress: Goal set today Pt Will Transfer to Toilet: with modified independence;Ambulation;with DME ADL Goal: Toilet Transfer - Progress: Goal set today Pt Will Perform Toileting - Clothing Manipulation: with modified independence;Sitting on 3-in-1 or toilet;Standing ADL Goal: Toileting - Clothing Manipulation - Progress: Goal set today Pt Will Perform Toileting - Hygiene: with modified independence;Sit to stand from 3-in-1/toilet ADL Goal: Toileting - Hygiene - Progress: Goal set today Pt Will Perform Tub/Shower Transfer: Tub transfer;Ambulation ADL Goal: Tub/Shower Transfer - Progress: Goal set today Additional ADL Goal #1: Pt will complete all aspects of bathing and dressing including item retrieval at mod I level. ADL Goal: Additional Goal #1 - Progress: Goal set today  Visit Information  Last OT Received On: 10/01/12 Assistance Needed: +1    Subjective Data  Subjective: I always thought the flu and a cold were the same thing. Patient Stated Goal: Feel better.   Prior Functioning     Home Living Lives With: Son;Daughter Type of Home: House Home Access: Stairs to enter Secretary/administrator of Steps: flight Entrance Stairs-Rails: None Home Layout: One level Bathroom Shower/Tub: Engineer, manufacturing systems: Standard Home Adaptive Equipment: Other (comment) (walking stick) Prior Function Level of Independence: Independent Able to Take Stairs?: Yes Driving: No Vocation: Part time employment Communication Communication: No  difficulties Dominant Hand: Right  Vision/Perception     Cognition  Overall Cognitive Status: Appears within functional limits for tasks assessed/performed Arousal/Alertness: Awake/alert Orientation Level: Appears intact for tasks assessed Behavior During Session: [facility] for tasks performed    Extremity/Trunk Assessment Right Upper Extremity Assessment RUE ROM/Strength/Tone: Deficits RUE ROM/Strength/Tone Deficits: ROM WFL. Strength 4-/5 grossly  Left Upper Extremity Assessment LUE ROM/Strength/Tone: Deficits LUE ROM/Strength/Tone Deficits: ROM WFL. Strength 4-/5 grossly      Mobility Bed Mobility Bed Mobility: Sit to Supine Supine to Sit: 6: Modified independent (Device/Increase time) Sit to Supine: 6: Modified independent (Device/Increase time) Transfers Sit to Stand: 5: Supervision;From bed;From toilet Stand to Sit: 5: Supervision;With upper extremity assist;To toilet;To bed Details for Transfer Assistance: Pt slightly unsteady upon initially standing.     Shoulder Instructions     Exercise     Balance Dynamic Standing Balance Dynamic Standing - Balance Support: No upper extremity supported;During functional activity Dynamic Standing - Level of Assistance: 5: Stand by assistance   End of Session OT - End of Session Activity Tolerance: Patient limited by fatigue Patient left: in bed;with call bell/phone within reach  GO     [name] Tutterow A OTR/L [phone] 10/01/2012, 3:54 PM

## 2012-10-01 NOTE — Progress Notes (Signed)
Patient states he already took a bath today and had his sheets changed in the afternoon.

## 2012-10-01 NOTE — Progress Notes (Signed)
[facility] PROGRESS NOTE  Christopher Meyers ZOX:[id number] DOB: 10/14/1965 DOA: 09/29/2012 PCP: No primary provider on file.  Assessment/Plan: #1 influenza A H1 N1 Influenza PCR was positive. Patient is slowly improving. Continue Tamiflu. Supportive care. Nebs. Continue empiric Levaquin. Follow.  #2 dehydration Gentle hydration with IV fluids.  #3 shortness of breath Likely secondary to problem #1 and possible bronchitis. CT of the chest negative for PE. Cardiac enzymes are negative x3. EKG with sinus tachycardia. Heart rate improved. Continue Tamiflu on empiric Levaquin.  #4 sinus tachycardia Likely secondary to problem #1. Improved.  #5 history of nonischemic cardiomyopathy Stable. Continue beta blocker. We'll continue to hold spironolactone and resume it in the next one to 2 days.  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  #7 polycythemia Likely secondary to dehydration and COPD. Improved with hydration. Follow.  #8 COPD Stable. Nebs as needed.  #9 prophylaxis Lovenox for DVT prophylaxis.  Code Status: Full Family Communication:  Updated the patient. No family at bedside. Disposition Plan: Home when medically stable   Consultants:  None  Procedures:  CT of the chest 09/30/2012  Antibiotics:  IV Levaquin 09/30/2012  Tamiflu 09/30/2012  HPI/Subjective: Patient states he's starting to feel a little bit better. Patient states was finally able to eat some food today.  Objective: Filed Vitals:   09/30/12 1500 09/30/12 2103 09/30/12 2300 10/01/12 0610  BP: 145/87 137/83  127/79  Pulse: 83 94  75  Temp: 99.3 F (37.4 C) 100.7 F (38.2 C) 98.7 F (37.1 C) 98.2 F (36.8 C)  TempSrc: Oral Oral Oral Oral  Resp: 18 20  20   Height:      Weight:    62.3 kg (137 lb 5.6 oz)  SpO2: 99% 96%  96%    Intake/Output Summary (Last 24 hours) at 10/01/12 1404 Last data filed at 10/01/12 1300  Gross per 24 hour  Intake   2500 ml  Output   2125 ml  Net    375  ml   Filed Weights   09/29/12 2024 09/30/12 0423 10/01/12 0610  Weight: 65.772 kg (145 lb) 61.3 kg (135 lb 2.3 oz) 62.3 kg (137 lb 5.6 oz)    Exam:   General:  NAD  Cardiovascular: RRR. No lower extremity edema. No JVD.  Respiratory: CTAB  Abdomen: Soft, nontender, nondistended, positive bowel sounds.  Data Reviewed: Basic Metabolic Panel:  Lab 10/01/12 8119 09/30/12 0530 09/30/12 0003  NA 134* 133* 132*  K 3.7 3.5 3.7  CL 101 100 94*  CO2 23 23 24   GLUCOSE 87 115* 89  BUN 9 13 11   CREATININE 0.92 1.01 1.07  CALCIUM 8.4 8.4 9.3  MG -- -- --  PHOS -- -- --   Liver Function Tests:  Lab 09/30/12 0003  AST 26  ALT 10  ALKPHOS 61  BILITOT 0.3  PROT 7.9  ALBUMIN 3.8    Lab 09/30/12 0530  LIPASE 38  AMYLASE --   No results found for this basename: AMMONIA:5 in the last 168 hours CBC:  Lab 10/01/12 0440 09/30/12 0530 09/30/12 0003  WBC 7.1 5.6 7.0  NEUTROABS -- -- 4.4  HGB 15.4 16.1 18.2*  HCT 44.5 46.8 52.2*  MCV 96.7 96.9 97.0  PLT 166 182 205   Cardiac Enzymes:  Lab 09/30/12 1619 09/30/12 1101 09/30/12 0530 09/30/12 0003  CKTOTAL -- -- -- --  CKMB -- -- -- --  CKMBINDEX -- -- -- --  TROPONINI <0.30 <0.30 <0.30 <0.30   BNP (  last 3 results)  Basename 09/30/12 0003  PROBNP 478.1*   CBG: No results found for this basename: GLUCAP:5 in the last 168 hours  Recent Results (from the past 240 hour(s))  CULTURE, BLOOD (ROUTINE X 2)     Status: Normal (Preliminary result)   Collection Time   09/30/12  2:23 AM      Component Value Range Status Comment   Specimen Description BLOOD LEFT HAND   Final    Special Requests BOTTLES DRAWN AEROBIC AND ANAEROBIC 4CC   Final    Culture  Setup Time 09/30/2012 08:47   Final    Culture     Final    Value:        BLOOD CULTURE RECEIVED NO GROWTH TO DATE CULTURE WILL BE HELD FOR 5 DAYS BEFORE ISSUING A FINAL NEGATIVE REPORT   Report Status PENDING   Incomplete   CULTURE, BLOOD (ROUTINE X 2)     Status: Normal  (Preliminary result)   Collection Time   09/30/12  2:28 AM      Component Value Range Status Comment   Specimen Description BLOOD RIGHT HAND   Final    Special Requests BOTTLES DRAWN AEROBIC AND ANAEROBIC 5CC   Final    Culture  Setup Time 09/30/2012 08:46   Final    Culture     Final    Value:        BLOOD CULTURE RECEIVED NO GROWTH TO DATE CULTURE WILL BE HELD FOR 5 DAYS BEFORE ISSUING A FINAL NEGATIVE REPORT   Report Status PENDING   Incomplete      Studies: Dg Chest 2 View  09/30/2012  *RADIOLOGY REPORT*  Clinical Data: Chest pain, fever.  CHEST - 2 VIEW  Comparison: 05/20/2011  Findings: Hyperinflation.  Minimal spurring in the mid thoracic spine. Lungs clear.  Heart size and pulmonary vascularity normal. No effusion.  IMPRESSION: No acute disease   Original Report Authenticated By: D. Hassell III, MD    Ct Angio Chest Pe W/cm &/or Wo Cm  09/30/2012  *RADIOLOGY REPORT*  Clinical Data: Increasing shortness of breath with fever and chills.  Elevated D-dimer.  CT ANGIOGRAPHY CHEST  Technique:  Multidetector CT imaging of the chest using the standard protocol during bolus administration of intravenous contrast. Multiplanar reconstructed images including MIPs were obtained and reviewed to evaluate the vascular anatomy.  Contrast: OMNIPAQUE IOHEXOL 350 MG/ML SOLN  Comparison: None.  Findings: No evidence for a filling defect within the opacified pulmonary arteries to suggest the presence of an acute pulmonary embolus.  No evidence for aneurysm or dissection of the thoracic aorta.  No evidence for axillary or mediastinal lymphadenopathy.  Upper normal sized lymph nodes are seen in the hilar regions bilaterally heart size is normal.  There is no pericardial or pleural effusion.  Lung windows show no dense focal airspace consolidation.  There is some very subtle tree in bud opacity involving the posterior right upper lobe (see images 44 - 46).  Bone windows reveal no worrisome lytic or sclerotic  osseous lesions.  IMPRESSION: No CT evidence for acute pulmonary embolus.  No thoracic aortic aneurysm or dissection.  Very subtle tree in bud opacity in the posterior right upper lobe may be related to scarring although atypical infection can have this appearance.   Original Report Authenticated By: Kennith Center, M.D.     Scheduled Meds:    . aspirin EC  81 mg Oral Daily  . carvedilol  3.125 mg Oral BID WC  .  enoxaparin (LOVENOX) injection  40 mg Subcutaneous Q24H  . levofloxacin (LEVAQUIN) IV  750 mg Intravenous Q0600  . oseltamivir  75 mg Oral BID  . sodium chloride  3 mL Intravenous Q12H   Continuous Infusions:   Principal Problem:  *Influenza A H1N1 infection Active Problems:  SOB (shortness of breath)  Fever  Dehydration  Cardiomyopathy  History of hepatitis C  Sinus tachycardia  Polycythemia  COPD (chronic obstructive pulmonary disease)    Time spent: > 30 mins    [facility]  [facility] Pager [phone]. If 8PM-8AM, please contact night-coverage at [email or web address], password [facility] 10/01/2012, 2:04 PM  LOS: 2 days

## 2012-10-01 NOTE — Evaluation (Signed)
Physical Therapy Evaluation Patient Details Name: DRUE HARR MRN: [id number] DOB: [birth year] Today's Date: 10/01/2012 Time: 3086-5784 PT Time Calculation (min): 12 min  PT Assessment / Plan / Recommendation Clinical Impression  Pt admitted with SOB.  Pt reports he has never felt this sick before.  Pt would benefit from acute PT services in order to improve generalized weakness and increase activity tolerance to return to PLOF for d/c home with his teenage children.    PT Assessment  Patient needs continued PT services    Follow Up Recommendations  No PT follow up    Does the patient have the potential to tolerate intense rehabilitation      Barriers to Discharge        Equipment Recommendations  None recommended by PT    Recommendations for Other Services     Frequency Min 3X/week    Precautions / Restrictions Precautions Precautions: Fall Restrictions Weight Bearing Restrictions: No   Pertinent Vitals/Pain No pain, pt reports SOB, SaO2 98% room air during ambulation      Mobility  Bed Mobility Bed Mobility: Supine to Sit Supine to Sit: 6: Modified independent (Device/Increase time);HOB elevated Transfers Transfers: Sit to Stand;Stand to Sit Sit to Stand: 4: Min guard;From bed Stand to Sit: 4: Min guard;To bed Details for Transfer Assistance: min/guard, pt a little unsteady upon rise Ambulation/Gait Ambulation/Gait Assistance: 4: Min guard Ambulation Distance (Feet): 80 Feet Assistive device: None Ambulation/Gait Assistance Details: pt reports feeling very weak and fatigue, unsteady gait so had pt push IV pole for more support last 40 feet, pt reports increased SOB with activity Gait Pattern: Step-through pattern;Decreased stride length;Right flexed knee in stance;Left flexed knee in stance Gait velocity: decreased General Gait Details: SaO2 98% room air with gait    Shoulder Instructions     Exercises     PT Diagnosis: Difficulty walking;Generalized  weakness  PT Problem List: Decreased activity tolerance;Decreased mobility;Decreased knowledge of use of DME PT Treatment Interventions: DME instruction;Gait training;Stair training;Functional mobility training;Therapeutic activities;Therapeutic exercise;Patient/family education   PT Goals Acute Rehab PT Goals PT Goal Formulation: With patient Time For Goal Achievement: 10/08/12 Potential to Achieve Goals: Good Pt will go Sit to Stand: with modified independence PT Goal: Sit to Stand - Progress: Goal set today Pt will go Stand to Sit: with modified independence PT Goal: Stand to Sit - Progress: Goal set today Pt will Ambulate: with modified independence;>150 feet PT Goal: Ambulate - Progress: Goal set today Pt will Go Up / Down Stairs: Flight;with modified independence PT Goal: Up/Down Stairs - Progress: Goal set today  Visit Information  Last PT Received On: 10/01/12 Assistance Needed: +1    Subjective Data  Subjective: I just feel so weak.  I've never been this sick.   Prior Functioning  Home Living Lives With: Son;Daughter Type of Home: House Home Access: Stairs to enter [employer] of Steps: "a lot"  close to a flight Home Layout: One level Home Adaptive Equipment: Other (comment) ("walking stick") Prior Function Level of Independence: Independent Communication Communication: No difficulties    Cognition  Overall Cognitive Status: Appears within functional limits for tasks assessed/performed Arousal/Alertness: Awake/alert Orientation Level: Appears intact for tasks assessed Behavior During Session: [facility] for tasks performed    Extremity/Trunk Assessment Right Upper Extremity Assessment RUE ROM/Strength/Tone: [facility] for tasks assessed Left Upper Extremity Assessment LUE ROM/Strength/Tone: [facility] for tasks assessed Right Lower Extremity Assessment RLE ROM/Strength/Tone: [facility] for tasks assessed Left Lower Extremity Assessment LLE ROM/Strength/Tone: [facility] for tasks  assessed  Balance    End of Session PT - End of Session Activity Tolerance: Patient limited by fatigue Patient left: in bed;with call bell/phone within reach  GP     [facility] E 10/01/2012, 10:03 AM Pager: [phone]

## 2012-10-02 LAB — CBC
Hemoglobin: 17 g/dL (ref 13.0–17.0)
MCHC: 35 g/dL (ref 30.0–36.0)
RDW: 13.2 % (ref 11.5–15.5)

## 2012-10-02 LAB — BASIC METABOLIC PANEL
GFR calc Af Amer: >90 mL/min (ref >90)
GFR calc non Af Amer: >90 mL/min (ref >90)
Potassium: 4 mEq/L (ref 3.5–5.1)
Sodium: 136 mEq/L (ref 135–145)

## 2012-10-02 LAB — PROCALCITONIN: Procalcitonin: <0.1 ng/mL

## 2012-10-02 MED ORDER — PHENOL 1.4 % MT LIQD
1.0000 | OROMUCOSAL | Status: DC | PRN
  Filled 2012-10-02: qty 177

## 2012-10-02 MED ORDER — MENTHOL 3 MG MT LOZG
1.0000 | LOZENGE | OROMUCOSAL | Status: DC | PRN
  Administered 2012-10-02: 3 mg via ORAL
  Filled 2012-10-02: qty 9

## 2012-10-02 NOTE — Progress Notes (Signed)
[facility] PROGRESS NOTE  Christopher Meyers MVH:[id number] DOB: [birth year] DOA: 09/29/2012 PCP: No primary provider on file.  Assessment/Plan: #1 influenza A H1 N1 Influenza PCR was positive. Patient is slowly improving. Continue Tamiflu. Supportive care. Nebs. Continue empiric Levaquin. Follow.  #2 dehydration Improved with hydration.  #3 shortness of breath Likely secondary to problem #1 and possible bronchitis. CT of the chest negative for PE. Cardiac enzymes are negative x3. EKG with sinus tachycardia. Heart rate improved. Continue Tamiflu on empiric Levaquin.  #4 sinus tachycardia Likely secondary to problem #1. Resolved.  #5 history of nonischemic cardiomyopathy Stable. Continue beta blocker. We'll continue to hold spironolactone and resume it in the next one to 2 days.  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  #7 polycythemia Likely secondary to dehydration and COPD. Improved with hydration. Follow.  #8 COPD Stable. Nebs as needed.  #9 prophylaxis Lovenox for DVT prophylaxis.  Code Status: Full Family Communication:  Updated the patient. No family at bedside. Disposition Plan: Home when medically stable   Consultants:  None  Procedures:  CT of the chest 09/30/2012  Antibiotics:  IV Levaquin 09/30/2012---> 10/01/12  Tamiflu 09/30/2012   Oral Levaquin 10/02/2012  HPI/Subjective: Patient states he's feeling  better. Patient states he started to eat.  Objective: Filed Vitals:   10/01/12 0610 10/01/12 1648 10/01/12 2228 10/02/12 0614  BP: 127/79 106/65 133/81 111/61  Pulse: 75 77 83 64  Temp: 98.2 F (36.8 C) 99.1 F (37.3 C) 98.9 F (37.2 C) 98.2 F (36.8 C)  TempSrc: Oral Oral Oral Oral  Resp: 20 18 18 19   Height:      Weight: 62.3 kg (137 lb 5.6 oz)   60.3 kg (132 lb 15 oz)  SpO2: 96% 98% 97% 94%    Intake/Output Summary (Last 24 hours) at 10/02/12 1359 Last data filed at 10/02/12 0618  Gross per 24 hour  Intake    460 ml  Output     575 ml  Net   -115 ml   Filed Weights   09/30/12 0423 10/01/12 0610 10/02/12 0614  Weight: 61.3 kg (135 lb 2.3 oz) 62.3 kg (137 lb 5.6 oz) 60.3 kg (132 lb 15 oz)    Exam:   General:  NAD  Cardiovascular: RRR. No lower extremity edema. No JVD.  Respiratory: CTAB  Abdomen: Soft, nontender, nondistended, positive bowel sounds.  Data Reviewed: Basic Metabolic Panel:  Lab 10/02/12 8413 10/01/12 0440 09/30/12 0530 09/30/12 0003  NA 136 134* 133* 132*  K 4.0 3.7 3.5 3.7  CL 100 101 100 94*  CO2 26 23 23 24   GLUCOSE 90 87 115* 89  BUN 12 9 13 11   CREATININE 0.96 0.92 1.01 1.07  CALCIUM 9.2 8.4 8.4 9.3  MG -- -- -- --  PHOS -- -- -- --   Liver Function Tests:  Lab 09/30/12 0003  AST 26  ALT 10  ALKPHOS 61  BILITOT 0.3  PROT 7.9  ALBUMIN 3.8    Lab 09/30/12 0530  LIPASE 38  AMYLASE --   No results found for this basename: AMMONIA:5 in the last 168 hours CBC:  Lab 10/02/12 0447 10/01/12 0440 09/30/12 0530 09/30/12 0003  WBC 7.5 7.1 5.6 7.0  NEUTROABS -- -- -- 4.4  HGB 17.0 15.4 16.1 18.2*  HCT 48.6 44.5 46.8 52.2*  MCV 95.7 96.7 96.9 97.0  PLT 184 166 182 205   Cardiac Enzymes:  Lab 09/30/12 1619 09/30/12 1101 09/30/12 0530 09/30/12 0003  CKTOTAL -- -- -- --  CKMB -- -- -- --  CKMBINDEX -- -- -- --  TROPONINI <0.30 <0.30 <0.30 <0.30   BNP (last 3 results)  Basename 09/30/12 0003  PROBNP 478.1*   CBG: No results found for this basename: GLUCAP:5 in the last 168 hours  Recent Results (from the past 240 hour(s))  CULTURE, BLOOD (ROUTINE X 2)     Status: Normal (Preliminary result)   Collection Time   09/30/12  2:23 AM      Component Value Range Status Comment   Specimen Description BLOOD LEFT HAND   Final    Special Requests BOTTLES DRAWN AEROBIC AND ANAEROBIC 4CC   Final    Culture  Setup Time 09/30/2012 08:47   Final    Culture     Final    Value:        BLOOD CULTURE RECEIVED NO GROWTH TO DATE CULTURE WILL BE HELD FOR 5 DAYS BEFORE ISSUING A  FINAL NEGATIVE REPORT   Report Status PENDING   Incomplete   CULTURE, BLOOD (ROUTINE X 2)     Status: Normal (Preliminary result)   Collection Time   09/30/12  2:28 AM      Component Value Range Status Comment   Specimen Description BLOOD RIGHT HAND   Final    Special Requests BOTTLES DRAWN AEROBIC AND ANAEROBIC 5CC   Final    Culture  Setup Time 09/30/2012 08:46   Final    Culture     Final    Value:        BLOOD CULTURE RECEIVED NO GROWTH TO DATE CULTURE WILL BE HELD FOR 5 DAYS BEFORE ISSUING A FINAL NEGATIVE REPORT   Report Status PENDING   Incomplete      Studies: No results found.  Scheduled Meds:    . aspirin EC  81 mg Oral Daily  . carvedilol  3.125 mg Oral BID WC  . enoxaparin (LOVENOX) injection  40 mg Subcutaneous Q24H  . levofloxacin  500 mg Oral Daily  . oseltamivir  75 mg Oral BID  . sodium chloride  3 mL Intravenous Q12H   Continuous Infusions:   Principal Problem:  *Influenza A H1N1 infection Active Problems:  SOB (shortness of breath)  Fever  Dehydration  Cardiomyopathy  History of hepatitis C  Sinus tachycardia  Polycythemia  COPD (chronic obstructive pulmonary disease)    Time spent: > 30 mins    [facility]  [facility] Pager [phone]. If 8PM-8AM, please contact night-coverage at [email or web address], password [facility] 10/02/2012, 1:59 PM  LOS: 3 days

## 2012-10-02 NOTE — Progress Notes (Signed)
Patient states he sweated a lot during the night and his sheets are damp. Currently, refusing to allow staff to change his sheets. He says his friend is supposed to bring him a razor around 0900 and would like to get a bath then, along with getting his sheets changed. RN will pass this info along to next shift.

## 2012-10-03 ENCOUNTER — Inpatient Hospital Stay (HOSPITAL_COMMUNITY): Payer: Medicaid Other

## 2012-10-03 DIAGNOSIS — I498 Other specified cardiac arrhythmias: Secondary | ICD-10-CM

## 2012-10-03 DIAGNOSIS — J449 Chronic obstructive pulmonary disease, unspecified: Secondary | ICD-10-CM

## 2012-10-03 DIAGNOSIS — J4489 Other specified chronic obstructive pulmonary disease: Secondary | ICD-10-CM

## 2012-10-03 MED ORDER — GUAIFENESIN ER 600 MG PO TB12
1200.0000 mg | ORAL_TABLET | Freq: Two times a day (BID) | ORAL | Status: DC
  Administered 2012-10-03 – 2012-10-04 (×3): 1200 mg via ORAL
  Filled 2012-10-03 (×4): qty 2

## 2012-10-03 NOTE — Progress Notes (Signed)
[facility] PROGRESS NOTE  Christopher Meyers ZOX:[id number] DOB: [birth year] DOA: 09/29/2012 PCP: No primary provider on file.  Assessment/Plan: #1 influenza A H1 N1 Influenza PCR was positive. Patient is slowly improving. Continue Tamiflu. Supportive care. Nebs. Continue empiric Levaquin. Follow.  #2 dehydration Improved with hydration.  #3 shortness of breath/ prob bronchitis Likely secondary to problem #1 and possible bronchitis. CT of the chest negative for PE. Cardiac enzymes are negative x3. EKG with sinus tachycardia. Heart rate improved. Repeat CXR. Continue Tamiflu and empiric Levaquin.  #4 sinus tachycardia Likely secondary to problem #1. Resolved.  #5 history of nonischemic cardiomyopathy Stable. Continue beta blocker. Will continue to hold spironolactone.  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  #7 polycythemia Likely secondary to dehydration and COPD. Improved with hydration. Follow.  #8 COPD Stable. Nebs as needed.  #9 prophylaxis Lovenox for DVT prophylaxis.  Code Status: Full Family Communication:  Updated the patient. No family at bedside. Disposition Plan: Home when medically stable   Consultants:  None  Procedures:  CT of the chest 09/30/2012  Antibiotics:  IV Levaquin 09/30/2012---> 10/01/12  Tamiflu 09/30/2012   Oral Levaquin 10/02/2012  HPI/Subjective: Patient states doesn't feel as well as he did yesterday. Patient states has pain on deep inspiration. Patient tolerating current diet.   Objective: Filed Vitals:   10/02/12 1425 10/02/12 2200 10/03/12 0506 10/03/12 1340  BP: 97/65 110/68 118/79 122/71  Pulse: 73 96 63 65  Temp: 98.4 F (36.9 C) 98 F (36.7 C) 97.2 F (36.2 C) 97.3 F (36.3 C)  TempSrc: Oral Oral Oral Oral  Resp: 18 18 18 20   Height:      Weight:   59.6 kg (131 lb 6.3 oz)   SpO2: 97% 96% 99% 97%    Intake/Output Summary (Last 24 hours) at 10/03/12 1347 Last data filed at 10/03/12 1300  Gross per 24  hour  Intake    720 ml  Output    275 ml  Net    445 ml   Filed Weights   10/01/12 0610 10/02/12 0614 10/03/12 0506  Weight: 62.3 kg (137 lb 5.6 oz) 60.3 kg (132 lb 15 oz) 59.6 kg (131 lb 6.3 oz)    Exam:   General:  NAD  Cardiovascular: RRR. No lower extremity edema. No JVD.  Respiratory: CTAB  Abdomen: Soft, nontender, nondistended, positive bowel sounds.  Data Reviewed: Basic Metabolic Panel:  Lab 10/02/12 8119 10/01/12 0440 09/30/12 0530 09/30/12 0003  NA 136 134* 133* 132*  K 4.0 3.7 3.5 3.7  CL 100 101 100 94*  CO2 26 23 23 24   GLUCOSE 90 87 115* 89  BUN 12 9 13 11   CREATININE 0.96 0.92 1.01 1.07  CALCIUM 9.2 8.4 8.4 9.3  MG -- -- -- --  PHOS -- -- -- --   Liver Function Tests:  Lab 09/30/12 0003  AST 26  ALT 10  ALKPHOS 61  BILITOT 0.3  PROT 7.9  ALBUMIN 3.8    Lab 09/30/12 0530  LIPASE 38  AMYLASE --   No results found for this basename: AMMONIA:5 in the last 168 hours CBC:  Lab 10/02/12 0447 10/01/12 0440 09/30/12 0530 09/30/12 0003  WBC 7.5 7.1 5.6 7.0  NEUTROABS -- -- -- 4.4  HGB 17.0 15.4 16.1 18.2*  HCT 48.6 44.5 46.8 52.2*  MCV 95.7 96.7 96.9 97.0  PLT 184 166 182 205   Cardiac Enzymes:  Lab 09/30/12 1619 09/30/12 1101 09/30/12 0530 09/30/12 0003  CKTOTAL -- -- -- --  CKMB -- -- -- --  CKMBINDEX -- -- -- --  TROPONINI <0.30 <0.30 <0.30 <0.30   BNP (last 3 results)  Basename 09/30/12 0003  PROBNP 478.1*   CBG: No results found for this basename: GLUCAP:5 in the last 168 hours  Recent Results (from the past 240 hour(s))  CULTURE, BLOOD (ROUTINE X 2)     Status: Normal (Preliminary result)   Collection Time   09/30/12  2:23 AM      Component Value Range Status Comment   Specimen Description BLOOD LEFT HAND   Final    Special Requests BOTTLES DRAWN AEROBIC AND ANAEROBIC 4CC   Final    Culture  Setup Time 09/30/2012 08:47   Final    Culture     Final    Value:        BLOOD CULTURE RECEIVED NO GROWTH TO DATE CULTURE WILL BE  HELD FOR 5 DAYS BEFORE ISSUING A FINAL NEGATIVE REPORT   Report Status PENDING   Incomplete   CULTURE, BLOOD (ROUTINE X 2)     Status: Normal (Preliminary result)   Collection Time   09/30/12  2:28 AM      Component Value Range Status Comment   Specimen Description BLOOD RIGHT HAND   Final    Special Requests BOTTLES DRAWN AEROBIC AND ANAEROBIC 5CC   Final    Culture  Setup Time 09/30/2012 08:46   Final    Culture     Final    Value:        BLOOD CULTURE RECEIVED NO GROWTH TO DATE CULTURE WILL BE HELD FOR 5 DAYS BEFORE ISSUING A FINAL NEGATIVE REPORT   Report Status PENDING   Incomplete      Studies: No results found.  Scheduled Meds:    . aspirin EC  81 mg Oral Daily  . carvedilol  3.125 mg Oral BID WC  . enoxaparin (LOVENOX) injection  40 mg Subcutaneous Q24H  . levofloxacin  500 mg Oral Daily  . oseltamivir  75 mg Oral BID  . sodium chloride  3 mL Intravenous Q12H   Continuous Infusions:   Principal Problem:  *Influenza A H1N1 infection Active Problems:  SOB (shortness of breath)  Fever  Dehydration  Cardiomyopathy  History of hepatitis C  Sinus tachycardia  Polycythemia  COPD (chronic obstructive pulmonary disease)    Time spent: > 30 mins    [facility]  [facility] Pager [phone]. If 8PM-8AM, please contact night-coverage at [email or web address], password [facility] 10/03/2012, 1:47 PM  LOS: 4 days

## 2012-10-03 NOTE — Progress Notes (Signed)
ANTIBIOTIC CONSULT NOTE - FOLLOW UP  Pharmacy Consult for Levofloxacin Indication: pneumonia  No Known Allergies  Patient Measurements: Height: 5\' 9"  (175.3 cm) Weight: 131 lb 6.3 oz (59.6 kg) IBW/kg (Calculated) : 70.7   Vital Signs: Temp: 97.2 F (36.2 C) (01/18 0506) Temp src: Oral (01/18 0506) BP: 118/79 mmHg (01/18 0506) Pulse Rate: 63  (01/18 0506) Intake/Output from previous day: 01/17 0701 - 01/18 0700 In: 600 [P.O.:600] Out: 275 [Urine:275] Intake/Output from this shift: Total I/O In: 240 [P.O.:240] Out: -   Labs:  Basename 10/02/12 0447 10/01/12 0440  WBC 7.5 7.1  HGB 17.0 15.4  PLT 184 166  LABCREA -- --  CREATININE 0.96 0.92   Estimated Creatinine Clearance: 80.2 ml/min (by C-G formula based on Cr of 0.96).   Microbiology: Recent Results (from the past 720 hour(s))  CULTURE, BLOOD (ROUTINE X 2)     Status: Normal (Preliminary result)   Collection Time   09/30/12  2:23 AM      Component Value Range Status Comment   Specimen Description BLOOD LEFT HAND   Final    Special Requests BOTTLES DRAWN AEROBIC AND ANAEROBIC 4CC   Final    Culture  Setup Time 09/30/2012 08:47   Final    Culture     Final    Value:        BLOOD CULTURE RECEIVED NO GROWTH TO DATE CULTURE WILL BE HELD FOR 5 DAYS BEFORE ISSUING A FINAL NEGATIVE REPORT   Report Status PENDING   Incomplete   CULTURE, BLOOD (ROUTINE X 2)     Status: Normal (Preliminary result)   Collection Time   09/30/12  2:28 AM      Component Value Range Status Comment   Specimen Description BLOOD RIGHT HAND   Final    Special Requests BOTTLES DRAWN AEROBIC AND ANAEROBIC 5CC   Final    Culture  Setup Time 09/30/2012 08:46   Final    Culture     Final    Value:        BLOOD CULTURE RECEIVED NO GROWTH TO DATE CULTURE WILL BE HELD FOR 5 DAYS BEFORE ISSUING A FINAL NEGATIVE REPORT   Report Status PENDING   Incomplete     Anti-infectives     Start     Dose/Rate Route Frequency Ordered Stop   10/02/12 1000    levofloxacin (LEVAQUIN) tablet 500 mg        500 mg Oral Daily 10/01/12 1409     09/30/12 0445   levofloxacin (LEVAQUIN) IVPB 750 mg  Status:  Discontinued        750 mg 100 mL/hr over 90 Minutes Intravenous Daily 09/30/12 0432 10/01/12 1409   09/30/12 0200   oseltamivir (TAMIFLU) capsule 75 mg        75 mg Oral 2 times daily 09/30/12 0156 10/04/12 2159          Assessment:  46 YOM admitted 1/15 with SOB, fever and chills  Day #4 levofloxacin for presumed PNA  Also day #4/5 Tamflu for +inflenza  Afeb, WBC wnl, SCr stable, blood cx neg to date  Goal of Therapy:  Eradication of infection, dose per renal function  Plan:   Continue levofloxacin 500mg  PO daily  F/U renal function, cultures, duration of therapy  Loralee Pacas, PharmD, BCPS Pager: [phone] 10/03/2012,10:50 AM

## 2012-10-04 DIAGNOSIS — J101 Influenza due to other identified influenza virus with other respiratory manifestations: Principal | ICD-10-CM

## 2012-10-04 DIAGNOSIS — R509 Fever, unspecified: Secondary | ICD-10-CM

## 2012-10-04 LAB — BASIC METABOLIC PANEL
Chloride: 102 mEq/L (ref 96–112)
GFR calc Af Amer: >90 mL/min (ref >90)
Potassium: 3.8 mEq/L (ref 3.5–5.1)
Sodium: 135 mEq/L (ref 135–145)

## 2012-10-04 MED ORDER — GUAIFENESIN ER 600 MG PO TB12: 1200.0000 mg | ORAL_TABLET | Freq: Two times a day (BID) | ORAL | Status: AC

## 2012-10-04 MED ORDER — LEVOFLOXACIN 500 MG PO TABS: 500.0000 mg | ORAL_TABLET | Freq: Every day | ORAL | Status: AC

## 2012-10-04 MED ORDER — ASPIRIN 81 MG PO TBEC: 81.0000 mg | DELAYED_RELEASE_TABLET | Freq: Every day | ORAL | Status: DC

## 2012-10-04 MED ORDER — CARVEDILOL 3.125 MG PO TABS: 3.1250 mg | ORAL_TABLET | Freq: Two times a day (BID) | ORAL | Status: DC

## 2012-10-04 MED ORDER — SPIRONOLACTONE 25 MG PO TABS: 25.0000 mg | ORAL_TABLET | Freq: Every day | ORAL | Status: DC

## 2012-10-04 NOTE — Discharge Summary (Signed)
Physician Discharge Summary  CHAUNCE WINKELS ZOX:[id number] DOB: 1966-08-05 DOA: 09/29/2012  PCP: No primary provider on file.  Admit date: 09/29/2012 Discharge date: 10/04/2012  Time spent: 60 minutes  Recommendations for Outpatient Follow-up:  1. Patient is to followup with PCP one week post discharge. On followup, patient will need a CBC and a basic metabolic profile done.  Discharge Diagnoses:  Principal Problem:  *Influenza A H1N1 infection Active Problems:  SOB (shortness of breath)  Fever  Dehydration  Cardiomyopathy  History of hepatitis C  Sinus tachycardia  Polycythemia  COPD (chronic obstructive pulmonary disease)   Discharge Condition: Stable and improved  Diet recommendation: Heart healthy  Filed Weights   10/02/12 0614 10/03/12 0506 10/04/12 0432  Weight: 60.3 kg (132 lb 15 oz) 59.6 kg (131 lb 6.3 oz) 60.8 kg (134 lb 0.6 oz)    History of present illness:  47 year old male with history of nonischemic cardiomyopathy has had cardiac catheter last year April in [facility] which was showing normal coronaries presents with complaints of worsening shortness of breath fever chills nausea vomiting and fatigue. Patient has been having these symptoms for last 3-4 days. Patient has been having productive cough for the same number of days. In the ER patient was found to be tachycardic and febrile. Chest x-ray does not show any acute UA is unremarkable. Patient at this time has been admitted for fever most likely from viral causes.    Hospital Course:  #1 influenza A H1 N1  Patient had presented to the ED with worsening shortness of breath, fever, chills, nausea vomiting generalized fatigue and a productive cough. Chest x-ray which was done was negative for any acute infiltrate. It was a concern for viral upper respiratory infection and a such an influenza PCR panel was done .Influenza PCR was positive, for H1 N1. Patient was placed empirically initially on  Tamiflu as well as Levaquin for probable bronchitis. Patient was monitored also. Was placed on supportive care. Patient improved daily on a daily basis such that by day of discharge patient was in stable and improved condition. Patient finished 5 days of Tamiflu during this hospitalization and will be discharged on 3 more days of oral Levaquin to complete a seven-day course of therapy for possible superimposed bronchitis. Patient will be discharged in stable and improved condition.  #2 dehydration  On admission patient was noted to be dehydrated. Patient's diuretics were initially held. Patient was hydrated with IV fluids and was euvolemic by day of discharge.  #3 shortness of breath/ prob bronchitis  Patient had presented with shortness of breath. Patient initially was empirically started on Tamiflu as well as Levaquin for probable bronchitis as chest x-ray was negative. Cardiac enzymes which were drawn and were negative x3. EKG done showed a sinus tachycardia which resolved during the hospitalization. CT angiogram of the chest was done which was negative for PE. Influenza PCR panel came back positive for H1 N1. Patient was treated with 5 days of Tamiflu. Patient be discharged home on 3 more days of oral Levaquin to complete a one-week course of therapy. Patient improved clinically and will followup with PCP as outpatient. #4 sinus tachycardia  On admission patient was noted to be in a sinus tachycardia. It was felt this was secondary to problem #1 and probable bronchitis. EKG which was done showed a sinus tachycardia. Cardiac enzymes which was cycled were negative x3. CT angiogram of the chest was done which was negative for PE. Patient was treated apparently  with Tamiflu, Levaquin, IV fluids, supportive care. Patient improved clinically in sinus tachycardia had resolved by day of discharge. Patient be discharged in stable and improved condition.    The rest of patient's chronic medical issues remained  stable throughout the hospitalization the patient was discharged in stable and improved condition.     Procedures: CT of the chest 09/30/2012   Consultations:  None  Discharge Exam: Filed Vitals:   10/03/12 1340 10/03/12 2143 10/04/12 0432 10/04/12 1416  BP: 122/71 116/73 109/74 112/69  Pulse: 65 67 62 67  Temp: 97.3 F (36.3 C) 97.7 F (36.5 C) 97.8 F (36.6 C) 97.2 F (36.2 C)  TempSrc: Oral Oral Oral Oral  Resp: 20 18 18 18   Height:      Weight:   60.8 kg (134 lb 0.6 oz)   SpO2: 97% 99% 98% 98%    General: NAD Cardiovascular: RRR Respiratory: CTAB  Discharge Instructions  Discharge Orders    Future Orders Please Complete By Expires   Diet - low sodium heart healthy      Increase activity slowly      Discharge instructions      Comments:   Follow up with PCP Dr Elizebeth Koller in 1 week.       Medication List     As of 10/04/2012  2:49 PM    STOP taking these medications         cephALEXin 500 MG capsule   Commonly known as: KEFLEX      TAKE these medications         antipyrine-benzocaine otic solution   Commonly known as: AURALGAN   Place 2-4 drops into the right ear 4 (four) times daily as needed. Impacted cerumen      aspirin 81 MG EC tablet   Take 1 tablet (81 mg total) by mouth daily.      carvedilol 3.125 MG tablet   Commonly known as: COREG   Take 1 tablet (3.125 mg total) by mouth 2 (two) times daily with a meal.      guaiFENesin 600 MG 12 hr tablet   Commonly known as: MUCINEX   Take 2 tablets (1,200 mg total) by mouth 2 (two) times daily. Take for 3 days then stop.      levofloxacin 500 MG tablet   Commonly known as: LEVAQUIN   Take 1 tablet (500 mg total) by mouth daily. Take for 3 days then stop.      nitroGLYCERIN 0.4 MG SL tablet   Commonly known as: NITROSTAT   Place 0.4 mg under the tongue every 5 (five) minutes as needed. Chest pain      spironolactone 25 MG tablet   Commonly known as: ALDACTONE   Take 1 tablet (25 mg  total) by mouth daily.           Follow-up Information    Schedule an appointment as soon as possible for a visit in 1 week to follow up. (Followup with PCP, Dr. Elizebeth Koller in 1 week.)           The results of significant diagnostics from this hospitalization (including imaging, microbiology, ancillary and laboratory) are listed below for reference.    Significant Diagnostic Studies: Dg Chest 2 View  10/03/2012  *RADIOLOGY REPORT*  Clinical Data: Shortness of breath.  CHEST - 2 VIEW  Comparison: 09/30/2012  Findings: Heart size is normal.  Main pulmonary arteries are prominent consistent with pulmonary arterial hypertension.  Chronic peribronchial thickening consistent with chronic  bronchitis.  No acute infiltrates or effusions.  Chronic accentuation of the interstitial markings.  IMPRESSION: No acute abnormalities.  Chronic bronchitic changes.  Probable pulmonary arterial hypertension.   Original Report Authenticated By: Francene Boyers, M.D.    Dg Chest 2 View  09/30/2012  *RADIOLOGY REPORT*  Clinical Data: Chest pain, fever.  CHEST - 2 VIEW  Comparison: 05/20/2011  Findings: Hyperinflation.  Minimal spurring in the mid thoracic spine. Lungs clear.  Heart size and pulmonary vascularity normal. No effusion.  IMPRESSION: No acute disease   Original Report Authenticated By: D. Hassell III, MD    Ct Angio Chest Pe W/cm &/or Wo Cm  09/30/2012  *RADIOLOGY REPORT*  Clinical Data: Increasing shortness of breath with fever and chills.  Elevated D-dimer.  CT ANGIOGRAPHY CHEST  Technique:  Multidetector CT imaging of the chest using the standard protocol during bolus administration of intravenous contrast. Multiplanar reconstructed images including MIPs were obtained and reviewed to evaluate the vascular anatomy.  Contrast: OMNIPAQUE IOHEXOL 350 MG/ML SOLN  Comparison: None.  Findings: No evidence for a filling defect within the opacified pulmonary arteries to suggest the presence of an acute  pulmonary embolus.  No evidence for aneurysm or dissection of the thoracic aorta.  No evidence for axillary or mediastinal lymphadenopathy.  Upper normal sized lymph nodes are seen in the hilar regions bilaterally heart size is normal.  There is no pericardial or pleural effusion.  Lung windows show no dense focal airspace consolidation.  There is some very subtle tree in bud opacity involving the posterior right upper lobe (see images 44 - 46).  Bone windows reveal no worrisome lytic or sclerotic osseous lesions.  IMPRESSION: No CT evidence for acute pulmonary embolus.  No thoracic aortic aneurysm or dissection.  Very subtle tree in bud opacity in the posterior right upper lobe may be related to scarring although atypical infection can have this appearance.   Original Report Authenticated By: Kennith Center, M.D.     Microbiology: Recent Results (from the past 240 hour(s))  CULTURE, BLOOD (ROUTINE X 2)     Status: Normal (Preliminary result)   Collection Time   09/30/12  2:23 AM      Component Value Range Status Comment   Specimen Description BLOOD LEFT HAND   Final    Special Requests BOTTLES DRAWN AEROBIC AND ANAEROBIC 4CC   Final    Culture  Setup Time 09/30/2012 08:47   Final    Culture     Final    Value:        BLOOD CULTURE RECEIVED NO GROWTH TO DATE CULTURE WILL BE HELD FOR 5 DAYS BEFORE ISSUING A FINAL NEGATIVE REPORT   Report Status PENDING   Incomplete   CULTURE, BLOOD (ROUTINE X 2)     Status: Normal (Preliminary result)   Collection Time   09/30/12  2:28 AM      Component Value Range Status Comment   Specimen Description BLOOD RIGHT HAND   Final    Special Requests BOTTLES DRAWN AEROBIC AND ANAEROBIC 5CC   Final    Culture  Setup Time 09/30/2012 08:46   Final    Culture     Final    Value:        BLOOD CULTURE RECEIVED NO GROWTH TO DATE CULTURE WILL BE HELD FOR 5 DAYS BEFORE ISSUING A FINAL NEGATIVE REPORT   Report Status PENDING   Incomplete      Labs: Basic Metabolic  Panel:  Lab 10/04/12 0516 10/02/12  1610 10/01/12 0440 09/30/12 0530 09/30/12 0003  NA 135 136 134* 133* 132*  K 3.8 4.0 3.7 3.5 3.7  CL 102 100 101 100 94*  CO2 24 26 23 23 24   GLUCOSE 79 90 87 115* 89  BUN 15 12 9 13 11   CREATININE 0.77 0.96 0.92 1.01 1.07  CALCIUM 8.8 9.2 8.4 8.4 9.3  MG -- -- -- -- --  PHOS -- -- -- -- --   Liver Function Tests:  Lab 09/30/12 0003  AST 26  ALT 10  ALKPHOS 61  BILITOT 0.3  PROT 7.9  ALBUMIN 3.8    Lab 09/30/12 0530  LIPASE 38  AMYLASE --   No results found for this basename: AMMONIA:5 in the last 168 hours CBC:  Lab 10/02/12 0447 10/01/12 0440 09/30/12 0530 09/30/12 0003  WBC 7.5 7.1 5.6 7.0  NEUTROABS -- -- -- 4.4  HGB 17.0 15.4 16.1 18.2*  HCT 48.6 44.5 46.8 52.2*  MCV 95.7 96.7 96.9 97.0  PLT 184 166 182 205   Cardiac Enzymes:  Lab 09/30/12 1619 09/30/12 1101 09/30/12 0530 09/30/12 0003  CKTOTAL -- -- -- --  CKMB -- -- -- --  CKMBINDEX -- -- -- --  TROPONINI <0.30 <0.30 <0.30 <0.30   BNP: BNP (last 3 results)  Basename 09/30/12 0003  PROBNP 478.1*   CBG: No results found for this basename: GLUCAP:5 in the last 168 hours     Signed:  THOMPSON,DANIEL  [facility] 10/04/2012, 2:49 PM

## 2012-10-06 LAB — CULTURE, BLOOD (ROUTINE X 2)
Culture: NO GROWTH
Culture: NO GROWTH

## 2014-08-16 ENCOUNTER — Emergency Department (HOSPITAL_COMMUNITY)
Admission: EM | Admit: 2014-08-16 | Discharge: 2014-08-16 | Disposition: A | Discharge status: Home / Self Care | Payer: Medicaid Other | Attending: Emergency Medicine | Admitting: Emergency Medicine

## 2014-08-16 ENCOUNTER — Encounter (HOSPITAL_COMMUNITY): Payer: Self-pay

## 2014-08-16 DIAGNOSIS — J449 Chronic obstructive pulmonary disease, unspecified: Secondary | ICD-10-CM | POA: Diagnosis not present

## 2014-08-16 DIAGNOSIS — R110 Nausea: Secondary | ICD-10-CM | POA: Diagnosis not present

## 2014-08-16 DIAGNOSIS — Z87891 Personal history of nicotine dependence: Secondary | ICD-10-CM | POA: Insufficient documentation

## 2014-08-16 DIAGNOSIS — T391X5A Adverse effect of 4-Aminophenol derivatives, initial encounter: Secondary | ICD-10-CM | POA: Insufficient documentation

## 2014-08-16 DIAGNOSIS — R51 Headache: Secondary | ICD-10-CM | POA: Insufficient documentation

## 2014-08-16 DIAGNOSIS — T43615A Adverse effect of caffeine, initial encounter: Secondary | ICD-10-CM | POA: Insufficient documentation

## 2014-08-16 DIAGNOSIS — Z79899 Other long term (current) drug therapy: Secondary | ICD-10-CM | POA: Insufficient documentation

## 2014-08-16 DIAGNOSIS — R109 Unspecified abdominal pain: Secondary | ICD-10-CM | POA: Diagnosis present

## 2014-08-16 DIAGNOSIS — R1013 Epigastric pain: Secondary | ICD-10-CM | POA: Diagnosis not present

## 2014-08-16 DIAGNOSIS — K2970 Gastritis, unspecified, without bleeding: Secondary | ICD-10-CM | POA: Insufficient documentation

## 2014-08-16 DIAGNOSIS — T39015A Adverse effect of aspirin, initial encounter: Secondary | ICD-10-CM | POA: Insufficient documentation

## 2014-08-16 DIAGNOSIS — R5383 Other fatigue: Secondary | ICD-10-CM | POA: Insufficient documentation

## 2014-08-16 HISTORY — DX: Barrett's esophagus without dysplasia: K22.70

## 2014-08-16 LAB — COMPREHENSIVE METABOLIC PANEL
ALT: 13 U/L (ref 0–53)
AST: 23 U/L (ref 0–37)
Albumin: 3.8 g/dL (ref 3.5–5.2)
Alkaline Phosphatase: 56 U/L (ref 39–117)
Anion gap: 12 (ref 5–15)
BUN: 10 mg/dL (ref 6–23)
CALCIUM: 9.3 mg/dL (ref 8.4–10.5)
CO2: 24 mEq/L (ref 19–32)
Chloride: 105 mEq/L (ref 96–112)
Creatinine, Ser: 1.36 mg/dL — ABNORMAL HIGH (ref 0.50–1.35)
GFR calc non Af Amer: 60 mL/min — ABNORMAL LOW (ref >90)
GFR, EST AFRICAN AMERICAN: 69 mL/min — AB (ref >90)
GLUCOSE: 67 mg/dL — AB (ref 70–99)
Potassium: 4.5 mEq/L (ref 3.7–5.3)
Sodium: 141 mEq/L (ref 137–147)
TOTAL PROTEIN: 6.9 g/dL (ref 6.0–8.3)
Total Bilirubin: <0.2 mg/dL — ABNORMAL LOW (ref 0.3–1.2)

## 2014-08-16 LAB — CBC WITH DIFFERENTIAL/PLATELET
Basophils Absolute: 0 10*3/uL (ref 0.0–0.1)
Basophils Relative: 0 % (ref 0–1)
EOS ABS: 0.2 10*3/uL (ref 0.0–0.7)
EOS PCT: 2 % (ref 0–5)
HCT: 44.3 % (ref 39.0–52.0)
HEMOGLOBIN: 14.4 g/dL (ref 13.0–17.0)
LYMPHS ABS: 2.9 10*3/uL (ref 0.7–4.0)
Lymphocytes Relative: 35 % (ref 12–46)
MCH: 31.1 pg (ref 26.0–34.0)
MCHC: 32.5 g/dL (ref 30.0–36.0)
MCV: 95.7 fL (ref 78.0–100.0)
MONOS PCT: 10 % (ref 3–12)
Monocytes Absolute: 0.9 10*3/uL (ref 0.1–1.0)
Neutro Abs: 4.3 10*3/uL (ref 1.7–7.7)
Neutrophils Relative %: 53 % (ref 43–77)
Platelets: 288 10*3/uL (ref 150–400)
RBC: 4.63 MIL/uL (ref 4.22–5.81)
RDW: 13.7 % (ref 11.5–15.5)
WBC: 8.2 10*3/uL (ref 4.0–10.5)

## 2014-08-16 LAB — URINALYSIS, ROUTINE W REFLEX MICROSCOPIC
Bilirubin Urine: NEGATIVE
Glucose, UA: NEGATIVE mg/dL
Hgb urine dipstick: NEGATIVE
Ketones, ur: NEGATIVE mg/dL
Leukocytes, UA: NEGATIVE
NITRITE: NEGATIVE
PROTEIN: NEGATIVE mg/dL
SPECIFIC GRAVITY, URINE: 1.021 (ref 1.005–1.030)
Urobilinogen, UA: 0.2 mg/dL (ref 0.0–1.0)
pH: 6.5 (ref 5.0–8.0)

## 2014-08-16 LAB — LIPASE, BLOOD: Lipase: 56 U/L (ref 11–59)

## 2014-08-16 LAB — POC OCCULT BLOOD, ED: FECAL OCCULT BLD: NEGATIVE

## 2014-08-16 MED ORDER — DEXTROSE 50 % IV SOLN
25.0000 mL | Freq: Once | INTRAVENOUS | Status: AC
  Administered 2014-08-16: 25 mL via INTRAVENOUS
  Filled 2014-08-16: qty 50

## 2014-08-16 MED ORDER — SODIUM CHLORIDE 0.9 % IV BOLUS (SEPSIS)
1000.0000 mL | Freq: Once | INTRAVENOUS | Status: AC
  Administered 2014-08-16: 1000 mL via INTRAVENOUS

## 2014-08-16 MED ORDER — FAMOTIDINE 20 MG PO TABS: 20.0000 mg | ORAL_TABLET | Freq: Two times a day (BID) | ORAL | Status: DC

## 2014-08-16 MED ORDER — GI COCKTAIL ~~LOC~~
30.0000 mL | Freq: Once | ORAL | Status: AC
  Administered 2014-08-16: 30 mL via ORAL
  Filled 2014-08-16: qty 30

## 2014-08-16 NOTE — ED Notes (Signed)
Kathlee Nations NP at bedside.

## 2014-08-16 NOTE — ED Notes (Signed)
Bed: ER74 Expected date:  Expected time:  Means of arrival:  Comments: Hold for triage [facility]

## 2014-08-16 NOTE — ED Notes (Signed)
Abdominal pain x 3 weeks.  Weakness.  No N/V/D.  ? Slight fever.  States change in stool color.  Migraine headaches.  Was taking goody powders for the headaches.

## 2014-08-16 NOTE — ED Provider Notes (Signed)
CSN: [id number]     Arrival date & time 08/16/14  1353 History   First MD Initiated Contact with Patient 08/16/14 1528     Chief Complaint  Patient presents with  . Abdominal Pain    (Consider location/radiation/quality/duration/timing/severity/associated sxs/prior Treatment) HPI Christopher Meyers is a 48 yo male presenting with report of abdominal pain x several weeks.  He reports he has had frequent headaches and has been taking daily Goody powders, which have made his stomach pain worse.  He stopped taking the Goody powders about 1 week ago and started taking Pepto-Bismol daily since then.  He states he has noticed some black stools since then but no diarrhea or loose stools.  He reports he has felt generally weak and tired without much energy.  He reports feeling nauseated but denies fevers, vomiting or diarrhea and he denies any chest pain, or shortness of breath.   Past Medical History  Diagnosis Date  . COPD (chronic obstructive pulmonary disease)   . Barrett's esophagus    Past Surgical History  Procedure Laterality Date  . Appendectomy    . Liver biopsy    . Cardiac catheterization     Family History  Problem Relation Age of Onset  . CAD Father   . Asthma Sister    History  Substance Use Topics  . Smoking status: Former Research scientist (life sciences)  . Smokeless tobacco: Not on file  . Alcohol Use: No    Review of Systems  Constitutional: Positive for fatigue. Negative for fever and chills.  HENT: Negative for sore throat.   Eyes: Negative for visual disturbance.  Respiratory: Negative for cough and shortness of breath.   Cardiovascular: Negative for chest pain and leg swelling.  Gastrointestinal: Positive for nausea and abdominal pain. Negative for vomiting and diarrhea.  Genitourinary: Negative for dysuria.  Musculoskeletal: Negative for myalgias.  Skin: Negative for rash.  Neurological: Negative for weakness, numbness and headaches.    Allergies  Review of patient's allergies  indicates no known allergies.  Home Medications   Prior to Admission medications   Medication Sig Start Date End Date Taking? Authorizing Provider  albuterol (PROVENTIL HFA;VENTOLIN HFA) 108 (90 BASE) MCG/ACT inhaler Inhale 1-2 puffs into the lungs every 6 (six) hours as needed for wheezing or shortness of breath.   Yes Historical Provider, MD  antipyrine-benzocaine Toniann Fail) otic solution Place 2-4 drops into the right ear 4 (four) times daily as needed. Impacted cerumen   Yes Historical Provider, MD  carvedilol (COREG) 3.125 MG tablet Take 1 tablet (3.125 mg total) by mouth 2 (two) times daily with a meal. 10/04/12  Yes Eugenie Filler, MD  lisinopril (PRINIVIL,ZESTRIL) 2.5 MG tablet Take 2.5 mg by mouth daily.   Yes Historical Provider, MD  nitroGLYCERIN (NITROSTAT) 0.4 MG SL tablet Place 0.4 mg under the tongue every 5 (five) minutes as needed. Chest pain   Yes Historical Provider, MD  OxyCODONE HCl, Abuse Deter, 5 MG TABA Take 1 tablet by mouth 2 (two) times daily.   Yes Historical Provider, MD  spironolactone (ALDACTONE) 25 MG tablet Take 1 tablet (25 mg total) by mouth daily. 10/04/12  Yes Eugenie Filler, MD  aspirin EC 81 MG EC tablet Take 1 tablet (81 mg total) by mouth daily. Patient not taking: Reported on 08/16/2014 10/04/12   Eugenie Filler, MD   BP 107/69 mmHg  Pulse 80  Temp(Src) 98 F (36.7 C) (Oral)  Resp 14  Ht 5\' 9"  (1.753 m)  Wt 145 lb (65.772  kg)  BMI 21.40 kg/m2  SpO2 98% Physical Exam  Constitutional: He appears well-developed and well-nourished. No distress.  HENT:  Head: Normocephalic and atraumatic.  Mouth/Throat: Oropharynx is clear and moist. No oropharyngeal exudate.  Eyes: Conjunctivae are normal.  Neck: Neck supple. No thyromegaly present.  Cardiovascular: Normal rate, regular rhythm and intact distal pulses.   Pulmonary/Chest: Effort normal and breath sounds normal. No respiratory distress. He has no wheezes. He has no rales. He exhibits no  tenderness.  Abdominal: Soft. There is no hepatosplenomegaly. There is tenderness in the epigastric area. There is no rigidity, no rebound, no guarding, no CVA tenderness, no tenderness at McBurney's point and negative Murphy's sign.    Musculoskeletal: He exhibits no tenderness.  Lymphadenopathy:    He has no cervical adenopathy.  Neurological: He is alert.  Skin: Skin is warm and dry. No rash noted. He is not diaphoretic.  Psychiatric: He has a normal mood and affect.  Nursing note and vitals reviewed.   ED Course  Procedures (including critical care time) Labs Review Labs Reviewed  COMPREHENSIVE METABOLIC PANEL - Abnormal; Notable for the following:    Glucose, Bld 67 (*)    Creatinine, Ser 1.36 (*)    Total Bilirubin <0.2 (*)    GFR calc non Af Amer 60 (*)    GFR calc Af Amer 69 (*)    All other components within normal limits  CBC WITH DIFFERENTIAL  URINALYSIS, ROUTINE W REFLEX MICROSCOPIC  LIPASE, BLOOD  POC OCCULT BLOOD, ED    Imaging Review No results found.   EKG Interpretation None      MDM   Final diagnoses:  Gastritis   48 yo with report of epigastric pain and general weakness since frequent use of Goody's powder. His report of black stools is most likely related to the frequent use of Pepto-Bismol as his hemoccult is negative.  His labs are without significant abnormality except for his glucose is mildly low which is consistent with his report of decreased oral intake and an elevated creatinine, consistent with his frequent NSAID intake.  He reports improvement of symptoms after 1/2 amp D50, NS bolus, and GI cocktail.  Pt is well-appearing, in no acute distress and vital signs are stable.  They appear safe to be discharged.  Discharge instructions include avoidance of NSAIDS and follow-up with their PCP to recheck creatinine level. Return precautions provided. Pt aware of plan and in agreement.      Filed Vitals:   08/16/14 1437 08/16/14 1705 08/16/14 1859    BP: 107/69 128/80 118/74  Pulse: 80 76 62  Temp: 98 F (36.7 C)    TempSrc: Oral    Resp: 14 16 18   Height: 5\' 9"  (1.753 m)    Weight: 145 lb (65.772 kg)    SpO2: 98% 100% 100%   Meds given in ED:  Medications  sodium chloride 0.9 % bolus 1,000 mL (0 mLs Intravenous Stopped 08/16/14 1808)  gi cocktail (Maalox,Lidocaine,Donnatal) (30 mLs Oral Given 08/16/14 1654)  dextrose 50 % solution 25 mL (25 mLs Intravenous Given 08/16/14 1702)    Discharge Medication List as of 08/16/2014  6:37 PM    START taking these medications   Details  famotidine (PEPCID) 20 MG tablet Take 1 tablet (20 mg total) by mouth 2 (two) times daily., Starting 08/16/2014, Until Discontinued, Print           Britt Bottom, NP 08/18/14 2306  Blanchie Dessert, MD 08/19/14 [phone]

## 2014-08-16 NOTE — Discharge Instructions (Signed)
Please follow the directions provided.  Be sure to follow-up with your primary care provider to ensure your are getting better and to re-check your creatinine to make sure it is improving.   It was 1.36 today.  This is most likely related to the [place] powder use.  Please avoid that medicine.  You may use tylenol for your headaches but make sure you are drinking enough water to stay well hydrated.  Use the Pepcid twice a day to help with your stomach.  Don't hesitate to return for new, worsening or concerning symptoms.    SEEK IMMEDIATE MEDICAL CARE IF:  You have black or dark red stools.  You vomit blood or material that looks like coffee grounds.  You are unable to keep fluids down.  Your abdominal pain gets worse.  You have a fever.  You do not feel better after 1 week.  You have any other questions or concerns

## 2015-02-10 DIAGNOSIS — R5382 Chronic fatigue, unspecified: Secondary | ICD-10-CM | POA: Diagnosis present

## 2015-05-30 DIAGNOSIS — K2270 Barrett's esophagus without dysplasia: Secondary | ICD-10-CM | POA: Diagnosis present

## 2017-05-11 DIAGNOSIS — S46911A Strain of unspecified muscle, fascia and tendon at shoulder and upper arm level, right arm, initial encounter: Secondary | ICD-10-CM | POA: Diagnosis not present

## 2017-05-11 DIAGNOSIS — R7989 Other specified abnormal findings of blood chemistry: Secondary | ICD-10-CM

## 2017-05-11 DIAGNOSIS — Z9119 Patient's noncompliance with other medical treatment and regimen: Secondary | ICD-10-CM | POA: Diagnosis not present

## 2017-05-11 DIAGNOSIS — I509 Heart failure, unspecified: Secondary | ICD-10-CM

## 2017-05-11 DIAGNOSIS — Z720 Tobacco use: Secondary | ICD-10-CM | POA: Diagnosis not present

## 2017-05-11 DIAGNOSIS — I2510 Atherosclerotic heart disease of native coronary artery without angina pectoris: Secondary | ICD-10-CM

## 2017-05-11 DIAGNOSIS — E039 Hypothyroidism, unspecified: Secondary | ICD-10-CM

## 2017-05-11 DIAGNOSIS — R0789 Other chest pain: Secondary | ICD-10-CM

## 2017-05-11 DIAGNOSIS — S40019A Contusion of unspecified shoulder, initial encounter: Secondary | ICD-10-CM | POA: Diagnosis not present

## 2017-05-11 DIAGNOSIS — R079 Chest pain, unspecified: Secondary | ICD-10-CM

## 2017-05-12 DIAGNOSIS — Z9119 Patient's noncompliance with other medical treatment and regimen: Secondary | ICD-10-CM | POA: Diagnosis not present

## 2017-05-12 DIAGNOSIS — R7989 Other specified abnormal findings of blood chemistry: Secondary | ICD-10-CM | POA: Diagnosis not present

## 2017-05-12 DIAGNOSIS — R0789 Other chest pain: Secondary | ICD-10-CM | POA: Diagnosis not present

## 2017-05-12 DIAGNOSIS — E039 Hypothyroidism, unspecified: Secondary | ICD-10-CM | POA: Diagnosis not present

## 2017-05-12 DIAGNOSIS — S46911A Strain of unspecified muscle, fascia and tendon at shoulder and upper arm level, right arm, initial encounter: Secondary | ICD-10-CM | POA: Diagnosis not present

## 2017-05-12 DIAGNOSIS — I509 Heart failure, unspecified: Secondary | ICD-10-CM

## 2017-05-12 DIAGNOSIS — S40019A Contusion of unspecified shoulder, initial encounter: Secondary | ICD-10-CM | POA: Diagnosis not present

## 2017-05-12 DIAGNOSIS — Z720 Tobacco use: Secondary | ICD-10-CM | POA: Diagnosis not present

## 2017-05-12 DIAGNOSIS — I2510 Atherosclerotic heart disease of native coronary artery without angina pectoris: Secondary | ICD-10-CM | POA: Diagnosis not present

## 2017-05-13 DIAGNOSIS — R7989 Other specified abnormal findings of blood chemistry: Secondary | ICD-10-CM | POA: Diagnosis not present

## 2017-05-13 DIAGNOSIS — R0789 Other chest pain: Secondary | ICD-10-CM | POA: Diagnosis not present

## 2017-05-13 DIAGNOSIS — I509 Heart failure, unspecified: Secondary | ICD-10-CM | POA: Diagnosis not present

## 2017-05-13 DIAGNOSIS — I2510 Atherosclerotic heart disease of native coronary artery without angina pectoris: Secondary | ICD-10-CM | POA: Diagnosis not present

## 2017-06-06 DIAGNOSIS — I2510 Atherosclerotic heart disease of native coronary artery without angina pectoris: Secondary | ICD-10-CM | POA: Diagnosis present

## 2017-07-10 DIAGNOSIS — I5022 Chronic systolic (congestive) heart failure: Secondary | ICD-10-CM | POA: Diagnosis present

## 2017-07-10 DIAGNOSIS — J431 Panlobular emphysema: Secondary | ICD-10-CM | POA: Diagnosis present

## 2017-08-06 DIAGNOSIS — Z9889 Other specified postprocedural states: Secondary | ICD-10-CM | POA: Insufficient documentation

## 2017-08-28 DIAGNOSIS — R0602 Shortness of breath: Secondary | ICD-10-CM | POA: Diagnosis not present

## 2017-08-28 DIAGNOSIS — I2510 Atherosclerotic heart disease of native coronary artery without angina pectoris: Secondary | ICD-10-CM | POA: Diagnosis not present

## 2017-08-28 DIAGNOSIS — I5023 Acute on chronic systolic (congestive) heart failure: Secondary | ICD-10-CM | POA: Diagnosis not present

## 2017-08-28 DIAGNOSIS — R0603 Acute respiratory distress: Secondary | ICD-10-CM | POA: Diagnosis not present

## 2017-08-28 DIAGNOSIS — J449 Chronic obstructive pulmonary disease, unspecified: Secondary | ICD-10-CM | POA: Diagnosis not present

## 2017-08-28 DIAGNOSIS — I110 Hypertensive heart disease with heart failure: Secondary | ICD-10-CM | POA: Diagnosis not present

## 2017-08-29 DIAGNOSIS — R0602 Shortness of breath: Secondary | ICD-10-CM | POA: Diagnosis not present

## 2017-08-29 DIAGNOSIS — J449 Chronic obstructive pulmonary disease, unspecified: Secondary | ICD-10-CM | POA: Diagnosis not present

## 2017-08-29 DIAGNOSIS — I110 Hypertensive heart disease with heart failure: Secondary | ICD-10-CM | POA: Diagnosis not present

## 2017-08-29 DIAGNOSIS — I5023 Acute on chronic systolic (congestive) heart failure: Secondary | ICD-10-CM | POA: Diagnosis not present

## 2017-08-29 DIAGNOSIS — R0603 Acute respiratory distress: Secondary | ICD-10-CM | POA: Diagnosis not present

## 2017-09-23 ENCOUNTER — Inpatient Hospital Stay (HOSPITAL_COMMUNITY)
Admission: EM | Admit: 2017-09-23 | Discharge: 2017-09-27 | DRG: 291 | Disposition: A | Discharge status: Home Health | Payer: Medicaid Other | Attending: Internal Medicine | Admitting: Internal Medicine

## 2017-09-23 ENCOUNTER — Emergency Department (HOSPITAL_COMMUNITY): Payer: Medicaid Other

## 2017-09-23 ENCOUNTER — Encounter (HOSPITAL_COMMUNITY): Payer: Self-pay | Admitting: Emergency Medicine

## 2017-09-23 DIAGNOSIS — I110 Hypertensive heart disease with heart failure: Principal | ICD-10-CM | POA: Diagnosis present

## 2017-09-23 DIAGNOSIS — G4700 Insomnia, unspecified: Secondary | ICD-10-CM | POA: Diagnosis present

## 2017-09-23 DIAGNOSIS — Z8249 Family history of ischemic heart disease and other diseases of the circulatory system: Secondary | ICD-10-CM

## 2017-09-23 DIAGNOSIS — J431 Panlobular emphysema: Secondary | ICD-10-CM | POA: Diagnosis present

## 2017-09-23 DIAGNOSIS — I5022 Chronic systolic (congestive) heart failure: Secondary | ICD-10-CM | POA: Diagnosis present

## 2017-09-23 DIAGNOSIS — E039 Hypothyroidism, unspecified: Secondary | ICD-10-CM | POA: Diagnosis present

## 2017-09-23 DIAGNOSIS — F17210 Nicotine dependence, cigarettes, uncomplicated: Secondary | ICD-10-CM | POA: Diagnosis present

## 2017-09-23 DIAGNOSIS — Z79890 Hormone replacement therapy (postmenopausal): Secondary | ICD-10-CM

## 2017-09-23 DIAGNOSIS — J9601 Acute respiratory failure with hypoxia: Secondary | ICD-10-CM | POA: Diagnosis present

## 2017-09-23 DIAGNOSIS — R5382 Chronic fatigue, unspecified: Secondary | ICD-10-CM | POA: Diagnosis present

## 2017-09-23 DIAGNOSIS — E785 Hyperlipidemia, unspecified: Secondary | ICD-10-CM | POA: Diagnosis present

## 2017-09-23 DIAGNOSIS — I5023 Acute on chronic systolic (congestive) heart failure: Secondary | ICD-10-CM | POA: Diagnosis present

## 2017-09-23 DIAGNOSIS — I428 Other cardiomyopathies: Secondary | ICD-10-CM

## 2017-09-23 DIAGNOSIS — I429 Cardiomyopathy, unspecified: Secondary | ICD-10-CM | POA: Diagnosis present

## 2017-09-23 DIAGNOSIS — B1920 Unspecified viral hepatitis C without hepatic coma: Secondary | ICD-10-CM | POA: Diagnosis present

## 2017-09-23 DIAGNOSIS — D638 Anemia in other chronic diseases classified elsewhere: Secondary | ICD-10-CM | POA: Diagnosis present

## 2017-09-23 DIAGNOSIS — Z9114 Patient's other noncompliance with medication regimen: Secondary | ICD-10-CM

## 2017-09-23 DIAGNOSIS — Z8619 Personal history of other infectious and parasitic diseases: Secondary | ICD-10-CM | POA: Diagnosis present

## 2017-09-23 DIAGNOSIS — Z7982 Long term (current) use of aspirin: Secondary | ICD-10-CM

## 2017-09-23 DIAGNOSIS — F419 Anxiety disorder, unspecified: Secondary | ICD-10-CM | POA: Diagnosis present

## 2017-09-23 DIAGNOSIS — Z79899 Other long term (current) drug therapy: Secondary | ICD-10-CM

## 2017-09-23 DIAGNOSIS — R591 Generalized enlarged lymph nodes: Secondary | ICD-10-CM | POA: Diagnosis present

## 2017-09-23 DIAGNOSIS — Z955 Presence of coronary angioplasty implant and graft: Secondary | ICD-10-CM

## 2017-09-23 DIAGNOSIS — I2510 Atherosclerotic heart disease of native coronary artery without angina pectoris: Secondary | ICD-10-CM | POA: Diagnosis present

## 2017-09-23 DIAGNOSIS — R296 Repeated falls: Secondary | ICD-10-CM | POA: Diagnosis present

## 2017-09-23 DIAGNOSIS — I509 Heart failure, unspecified: Secondary | ICD-10-CM

## 2017-09-23 DIAGNOSIS — J449 Chronic obstructive pulmonary disease, unspecified: Secondary | ICD-10-CM | POA: Diagnosis present

## 2017-09-23 DIAGNOSIS — K2270 Barrett's esophagus without dysplasia: Secondary | ICD-10-CM | POA: Diagnosis present

## 2017-09-23 DIAGNOSIS — D751 Secondary polycythemia: Secondary | ICD-10-CM | POA: Diagnosis present

## 2017-09-23 DIAGNOSIS — Z825 Family history of asthma and other chronic lower respiratory diseases: Secondary | ICD-10-CM

## 2017-09-23 HISTORY — DX: Essential (primary) hypertension: I10

## 2017-09-23 HISTORY — DX: Atherosclerotic heart disease of native coronary artery without angina pectoris: I25.10

## 2017-09-23 HISTORY — DX: Inflammatory liver disease, unspecified: K75.9

## 2017-09-23 LAB — I-STAT VENOUS BLOOD GAS, ED
Acid-base deficit: 5 mmol/L — ABNORMAL HIGH (ref 0.0–2.0)
BICARBONATE: 19.1 mmol/L — AB (ref 20.0–28.0)
O2 Saturation: 89 %
PCO2 VEN: 33.2 mmHg — AB (ref 44.0–60.0)
TCO2: 20 mmol/L — AB (ref 22–32)
pH, Ven: 7.368 (ref 7.250–7.430)
pO2, Ven: 57 mmHg — ABNORMAL HIGH (ref 32.0–45.0)

## 2017-09-23 LAB — BASIC METABOLIC PANEL
ANION GAP: 11 (ref 5–15)
BUN: 30 mg/dL — ABNORMAL HIGH (ref 6–20)
CO2: 17 mmol/L — ABNORMAL LOW (ref 22–32)
Calcium: 8.6 mg/dL — ABNORMAL LOW (ref 8.9–10.3)
Chloride: 110 mmol/L (ref 101–111)
Creatinine, Ser: 1.09 mg/dL (ref 0.61–1.24)
GFR calc Af Amer: >60 mL/min (ref >60)
GFR calc non Af Amer: >60 mL/min (ref >60)
GLUCOSE: 103 mg/dL — AB (ref 65–99)
Potassium: 3.9 mmol/L (ref 3.5–5.1)
Sodium: 138 mmol/L (ref 135–145)

## 2017-09-23 LAB — IRON AND TIBC
Iron: 38 ug/dL — ABNORMAL LOW (ref 45–182)
Saturation Ratios: 9 % — ABNORMAL LOW (ref 17.9–39.5)
TIBC: 420 ug/dL (ref 250–450)
UIBC: 382 ug/dL

## 2017-09-23 LAB — CBC
HCT: 36.1 % — ABNORMAL LOW (ref 39.0–52.0)
Hemoglobin: 11.1 g/dL — ABNORMAL LOW (ref 13.0–17.0)
MCH: 29 pg (ref 26.0–34.0)
MCHC: 30.7 g/dL (ref 30.0–36.0)
MCV: 94.3 fL (ref 78.0–100.0)
Platelets: 261 10*3/uL (ref 150–400)
RBC: 3.83 MIL/uL — ABNORMAL LOW (ref 4.22–5.81)
RDW: 15.9 % — ABNORMAL HIGH (ref 11.5–15.5)
WBC: 7.9 10*3/uL (ref 4.0–10.5)

## 2017-09-23 LAB — PROTIME-INR
INR: 1.15
PROTHROMBIN TIME: 14.6 s (ref 11.4–15.2)

## 2017-09-23 LAB — RAPID URINE DRUG SCREEN, HOSP PERFORMED
AMPHETAMINES: POSITIVE — AB
Barbiturates: NOT DETECTED
Benzodiazepines: NOT DETECTED
Cocaine: NOT DETECTED
OPIATES: NOT DETECTED
Tetrahydrocannabinol: NOT DETECTED

## 2017-09-23 LAB — BRAIN NATRIURETIC PEPTIDE: B NATRIURETIC PEPTIDE 5: 2427.1 pg/mL — AB (ref 0.0–100.0)

## 2017-09-23 LAB — RETICULOCYTES
RBC.: 4.53 MIL/uL (ref 4.22–5.81)
RETIC CT PCT: 1.3 % (ref 0.4–3.1)
Retic Count, Absolute: 58.9 10*3/uL (ref 19.0–186.0)

## 2017-09-23 LAB — HEMOGLOBIN A1C
HEMOGLOBIN A1C: 5.5 % (ref 4.8–5.6)
MEAN PLASMA GLUCOSE: 111.15 mg/dL

## 2017-09-23 LAB — D-DIMER, QUANTITATIVE: D-Dimer, Quant: 1.78 ug/mL-FEU — ABNORMAL HIGH (ref 0.00–0.50)

## 2017-09-23 LAB — I-STAT TROPONIN, ED: Troponin i, poc: 0.01 ng/mL (ref 0.00–0.08)

## 2017-09-23 LAB — FOLATE: FOLATE: 31 ng/mL (ref >5.9)

## 2017-09-23 LAB — FERRITIN: Ferritin: 28 ng/mL (ref 24–336)

## 2017-09-23 LAB — TSH: TSH: 3.88 u[IU]/mL (ref 0.350–4.500)

## 2017-09-23 LAB — VITAMIN B12: VITAMIN B 12: 713 pg/mL (ref 180–914)

## 2017-09-23 LAB — ETHANOL

## 2017-09-23 MED ORDER — FUROSEMIDE 10 MG/ML IJ SOLN
20.0000 mg | Freq: Once | INTRAMUSCULAR | Status: AC
  Administered 2017-09-23: 20 mg via INTRAVENOUS
  Filled 2017-09-23: qty 2

## 2017-09-23 MED ORDER — ENOXAPARIN SODIUM 40 MG/0.4ML ~~LOC~~ SOLN
40.0000 mg | SUBCUTANEOUS | Status: DC
  Administered 2017-09-23 – 2017-09-26 (×4): 40 mg via SUBCUTANEOUS
  Filled 2017-09-23 (×5): qty 0.4

## 2017-09-23 MED ORDER — ACETAMINOPHEN 650 MG RE SUPP: 650.0000 mg | Freq: Four times a day (QID) | RECTAL | Status: DC | PRN

## 2017-09-23 MED ORDER — ACETAMINOPHEN 325 MG PO TABS
650.0000 mg | ORAL_TABLET | Freq: Four times a day (QID) | ORAL | Status: DC | PRN
  Administered 2017-09-27: 650 mg via ORAL
  Filled 2017-09-23: qty 2

## 2017-09-23 MED ORDER — LISINOPRIL 2.5 MG PO TABS: 2.5000 mg | ORAL_TABLET | Freq: Every day | ORAL | Status: DC

## 2017-09-23 MED ORDER — DIGOXIN 125 MCG PO TABS
0.1250 mg | ORAL_TABLET | Freq: Every day | ORAL | Status: DC
  Administered 2017-09-23 – 2017-09-27 (×5): 0.125 mg via ORAL
  Filled 2017-09-23 (×5): qty 1

## 2017-09-23 MED ORDER — LOSARTAN POTASSIUM 25 MG PO TABS
12.5000 mg | ORAL_TABLET | Freq: Every day | ORAL | Status: DC
  Administered 2017-09-23 – 2017-09-26 (×4): 12.5 mg via ORAL
  Filled 2017-09-23 (×5): qty 1

## 2017-09-23 MED ORDER — ASPIRIN EC 81 MG PO TBEC
81.0000 mg | DELAYED_RELEASE_TABLET | Freq: Every day | ORAL | Status: DC
  Administered 2017-09-23 – 2017-09-27 (×5): 81 mg via ORAL
  Filled 2017-09-23 (×5): qty 1

## 2017-09-23 MED ORDER — BISACODYL 10 MG RE SUPP: 10.0000 mg | Freq: Every day | RECTAL | Status: DC | PRN

## 2017-09-23 MED ORDER — IOPAMIDOL (ISOVUE-370) INJECTION 76%
INTRAVENOUS | Status: AC
  Filled 2017-09-23: qty 100

## 2017-09-23 MED ORDER — ALBUTEROL SULFATE (2.5 MG/3ML) 0.083% IN NEBU: 2.5000 mg | INHALATION_SOLUTION | RESPIRATORY_TRACT | Status: DC | PRN

## 2017-09-23 MED ORDER — SENNOSIDES-DOCUSATE SODIUM 8.6-50 MG PO TABS: 1.0000 | ORAL_TABLET | Freq: Every evening | ORAL | Status: DC | PRN

## 2017-09-23 MED ORDER — TRAZODONE HCL 50 MG PO TABS
25.0000 mg | ORAL_TABLET | Freq: Every evening | ORAL | Status: DC | PRN
  Administered 2017-09-25 – 2017-09-26 (×2): 25 mg via ORAL
  Filled 2017-09-23 (×2): qty 1

## 2017-09-23 MED ORDER — HYDROCODONE-ACETAMINOPHEN 5-325 MG PO TABS
1.0000 | ORAL_TABLET | ORAL | Status: DC | PRN
  Administered 2017-09-23 – 2017-09-27 (×4): 2 via ORAL
  Filled 2017-09-23 (×4): qty 2

## 2017-09-23 MED ORDER — ONDANSETRON HCL 4 MG PO TABS: 4.0000 mg | ORAL_TABLET | Freq: Four times a day (QID) | ORAL | Status: DC | PRN

## 2017-09-23 MED ORDER — FAMOTIDINE 20 MG PO TABS: 20.0000 mg | ORAL_TABLET | ORAL | Status: DC | PRN

## 2017-09-23 MED ORDER — ATORVASTATIN CALCIUM 40 MG PO TABS
40.0000 mg | ORAL_TABLET | Freq: Every day | ORAL | Status: DC
  Administered 2017-09-23 – 2017-09-27 (×5): 40 mg via ORAL
  Filled 2017-09-23 (×5): qty 1

## 2017-09-23 MED ORDER — LEVOTHYROXINE SODIUM 25 MCG PO TABS
25.0000 ug | ORAL_TABLET | Freq: Every day | ORAL | Status: DC
  Administered 2017-09-23 – 2017-09-27 (×5): 25 ug via ORAL
  Filled 2017-09-23 (×6): qty 1

## 2017-09-23 MED ORDER — SODIUM CHLORIDE 0.9% FLUSH
10.0000 mL | INTRAVENOUS | Status: DC | PRN
  Administered 2017-09-24: 20 mL
  Administered 2017-09-26: 10 mL
  Filled 2017-09-23 (×2): qty 40

## 2017-09-23 MED ORDER — FUROSEMIDE 10 MG/ML IJ SOLN
80.0000 mg | Freq: Two times a day (BID) | INTRAMUSCULAR | Status: DC
  Administered 2017-09-23: 80 mg via INTRAVENOUS
  Filled 2017-09-23 (×2): qty 8

## 2017-09-23 MED ORDER — ONDANSETRON HCL 4 MG/2ML IJ SOLN: 4.0000 mg | Freq: Four times a day (QID) | INTRAMUSCULAR | Status: DC | PRN

## 2017-09-23 NOTE — ED Provider Notes (Signed)
[facility] EMERGENCY DEPARTMENT Provider Note   CSN: [id number] Arrival date & time: 09/23/17  0121     History   Chief Complaint Chief Complaint  Patient presents with  . COPD  . Leg Swelling  Chief complaint shortness of breath "I have a weak heart"  HPI Christopher Meyers is a 52 y.o. male.  Patient reports shortness of breath and generalized weakness for approximately 2 weeks stating "I have fluid buildup".  He denies pain anywhere.  Denies cough denies fever.  States "I have a weak heart" formally followed by [place] [facility]  He reports he is fainted several times in the past 3 days.  No other associated symptoms.  No cough.  Nothing makes symptoms better or worse.  Other associated symptoms include leg swelling bilaterally.  No other associated symptoms  HPI  Past Medical History:  Diagnosis Date  . Barrett's esophagus   . COPD (chronic obstructive pulmonary disease) ([place])   . Coronary artery disease     Patient Active Problem List   Diagnosis Date Noted  . Sinus tachycardia 10/01/2012  . Polycythemia 10/01/2012  . COPD (chronic obstructive pulmonary disease) ([place]) 10/01/2012  . SOB (shortness of breath) 09/30/2012  . Fever 09/30/2012  . Dehydration 09/30/2012  . Cardiomyopathy ([place]) 09/30/2012  . History of hepatitis C 09/30/2012  . Influenza A H1N1 infection 09/30/2012    Past Surgical History:  Procedure Laterality Date  . APPENDECTOMY    . CARDIAC CATHETERIZATION    . CORONARY STENT PLACEMENT    . LIVER BIOPSY         Home Medications    Prior to Admission medications   Medication Sig Start Date End Date Taking? Authorizing Provider  aspirin EC 81 MG EC tablet Take 1 tablet (81 mg total) by mouth daily. 10/04/12  Yes Eugenie Filler, MD  atorvastatin (LIPITOR) 40 MG tablet Take 40 mg by mouth daily. 06/06/17  Yes [provider]  carvedilol (COREG) 3.125 MG tablet Take 1 tablet (3.125 mg total) by mouth 2 (two) times  daily with a meal. 10/04/12  Yes Eugenie Filler, MD  furosemide (LASIX) 40 MG tablet Take 40 mg by mouth daily. 08/27/17 11/25/17 Yes [provider]  gabapentin (NEURONTIN) 300 MG capsule Take 300 mg by mouth 3 (three) times daily as needed. 06/06/17  Yes [provider]  levothyroxine (SYNTHROID, LEVOTHROID) 25 MCG tablet Take 25 mcg by mouth daily. 06/06/17  Yes [provider]  lisinopril (PRINIVIL,ZESTRIL) 2.5 MG tablet Take 2.5 mg by mouth daily.   Yes [provider]  albuterol (PROVENTIL HFA;VENTOLIN HFA) 108 (90 BASE) MCG/ACT inhaler Inhale 1-2 puffs into the lungs every 6 (six) hours as needed for wheezing or shortness of breath.    [provider]  antipyrine-benzocaine Toniann Fail) otic solution Place 2-4 drops into the right ear 4 (four) times daily as needed. Impacted cerumen    [provider]  famotidine (PEPCID) 20 MG tablet Take 1 tablet (20 mg total) by mouth 2 (two) times daily. Patient taking differently: Take 20 mg by mouth as needed for heartburn or indigestion.  08/16/14   Britt Bottom, NP  nitroGLYCERIN (NITROSTAT) 0.4 MG SL tablet Place 0.4 mg under the tongue every 5 (five) minutes as needed. Chest pain    [provider]  spironolactone (ALDACTONE) 25 MG tablet Take 1 tablet (25 mg total) by mouth daily. Patient not taking: Reported on 09/23/2017 10/04/12   Eugenie Filler, MD  Family History Family History  Problem Relation Age of Onset  . CAD Father   . Asthma Sister     Social History Social History   Tobacco Use  . Smoking status: Heavy Tobacco Smoker  . Smokeless tobacco: Never Used  Substance Use Topics  . Alcohol use: No  . Drug use: No     Allergies   Patient has no known allergies.   Review of Systems Review of Systems  Respiratory: Positive for shortness of breath.   Cardiovascular: Positive for leg swelling.  All other systems reviewed and are negative.    Physical  Exam Updated Vital Signs BP 107/82 (BP Location: Right Arm)   Pulse 95   Temp (!) 97.3 F (36.3 C) (Oral)   Resp 18   SpO2 98%   Physical Exam  Constitutional:  Chronically ill-appearing falls asleep as I examine him.  Arousable to tactile stimulus.  Answers questions appropriately  HENT:  Head: Normocephalic and atraumatic.  Eyes: Conjunctivae are normal. Pupils are equal, round, and reactive to light.  Neck: Neck supple. No tracheal deviation present. No thyromegaly present.  Cardiovascular: Regular rhythm.  No murmur heard. Mildly tachycardic.  Heart rate approximately 100 bpm  Pulmonary/Chest: Effort normal and breath sounds normal.  Abdominal: Soft. Bowel sounds are normal. He exhibits no distension. There is no tenderness.  Musculoskeletal: Normal range of motion. He exhibits no edema or tenderness.  No peripheral edema  Neurological: He is alert. Coordination normal.  Skin: Skin is warm and dry. No rash noted.  Psychiatric: He has a normal mood and affect.  Nursing note and vitals reviewed.    ED Treatments / Results  Labs (all labs ordered are listed, but only abnormal results are displayed) Labs Reviewed  BASIC METABOLIC PANEL - Abnormal; Notable for the following components:      Result Value   CO2 17 (*)    Glucose, Bld 103 (*)    BUN 30 (*)    Calcium 8.6 (*)    All other components within normal limits  CBC - Abnormal; Notable for the following components:   RBC 3.83 (*)    Hemoglobin 11.1 (*)    HCT 36.1 (*)    RDW 15.9 (*)    All other components within normal limits  BLOOD GAS, VENOUS  BRAIN NATRIURETIC PEPTIDE  I-STAT TROPONIN, ED   Results for orders placed or performed during the hospital encounter of 31/54/00  Basic metabolic panel  Result Value Ref Range   Sodium 138 135 - 145 mmol/L   Potassium 3.9 3.5 - 5.1 mmol/L   Chloride 110 101 - 111 mmol/L   CO2 17 (L) 22 - 32 mmol/L   Glucose, Bld 103 (H) 65 - 99 mg/dL   BUN 30 (H) 6 - 20 mg/dL     Creatinine, Ser 1.09 0.61 - 1.24 mg/dL   Calcium 8.6 (L) 8.9 - 10.3 mg/dL   GFR calc non Af Amer >60 >60 mL/min   GFR calc Af Amer >60 >60 mL/min   Anion gap 11 5 - 15  CBC  Result Value Ref Range   WBC 7.9 4.0 - 10.5 K/uL   RBC 3.83 (L) 4.22 - 5.81 MIL/uL   Hemoglobin 11.1 (L) 13.0 - 17.0 g/dL   HCT 36.1 (L) 39.0 - 52.0 %   MCV 94.3 78.0 - 100.0 fL   MCH 29.0 26.0 - 34.0 pg   MCHC 30.7 30.0 - 36.0 g/dL   RDW 15.9 (H) 11.5 - 15.5 %  Platelets 261 150 - 400 K/uL  Brain natriuretic peptide  Result Value Ref Range   B Natriuretic Peptide 2,427.1 (H) 0.0 - 100.0 pg/mL  D-dimer, quantitative (not at [facility])  Result Value Ref Range   D-Dimer, Quant 1.78 (H) 0.00 - 0.50 ug/mL-FEU  I-stat troponin, ED  Result Value Ref Range   Troponin i, poc 0.01 0.00 - 0.08 ng/mL   Comment 3          I-Stat venous blood gas, ED  Result Value Ref Range   pH, Ven 7.368 7.250 - 7.430   pCO2, Ven 33.2 (L) 44.0 - 60.0 mmHg   pO2, Ven 57.0 (H) 32.0 - 45.0 mmHg   Bicarbonate 19.1 (L) 20.0 - 28.0 mmol/L   TCO2 20 (L) 22 - 32 mmol/L   O2 Saturation 89.0 %   Acid-base deficit 5.0 (H) 0.0 - 2.0 mmol/L   Patient temperature HIDE    Sample type VENOUS    Dg Chest 2 View  Result Date: 09/23/2017 CLINICAL DATA:  Dyspnea with fatigue. Finger and lower extremity edema onset yesterday. EXAM: CHEST  2 VIEW COMPARISON:  08/28/2017. FINDINGS: Enlarged but stable cardiac silhouette. Aortic atherosclerosis without aneurysm. Interstitial edema persists with small bilateral pleural effusions blunting the posterior costophrenic angles. Stable thoracic kyphosis with multilevel degenerative disc disease. IMPRESSION: Cardiomegaly with mild interstitial edema and small posterior pleural effusions. Electronically Signed   By: Ashley Royalty M.D.   On: 09/23/2017 02:46   Ct Angio Chest Pe W And/or Wo Contrast  Result Date: 09/23/2017 CLINICAL DATA:  Chest pain EXAM: CT ANGIOGRAPHY CHEST WITH CONTRAST TECHNIQUE: Multidetector CT  imaging of the chest was performed using the standard protocol during bolus administration of intravenous contrast. Multiplanar CT image reconstructions and MIPs were obtained to evaluate the vascular anatomy. CONTRAST:  80 mL Isovue 370 nonionic COMPARISON:  Chest CT September 30, 2012; chest radiograph September 23, 2017 FINDINGS: Cardiovascular: There is no demonstrable pulmonary embolus. There is no appreciable thoracic aortic aneurysm. The contrast bolus is not sufficient within the aorta to assess for potential dissection. Visualized great vessels appear unremarkable with limited contrast in these vessels. There is no appreciable pericardial effusion or pericardial thickening. Heart is mildly enlarged. There is prominence of the main pulmonary outflow tract with the main pulmonary outflow tract measuring 3.1 cm in diameter. Mediastinum/Nodes: Thyroid appears unremarkable. There is a lymph node anterior to the distal trachea on the right measuring 1.6 x 1.0 cm. There is a lymph node in the right hilar region measuring 1.6 x 1.5 cm. There is a lymph node in the subcarinal region measuring 1.5 x 1.3 cm. There is a lymph node in the aortopulmonary window measuring 1.5 x 1.4 cm. Several subcentimeter lymph nodes elsewhere noted. No esophageal lesions are evident. Lungs/Pleura: There are moderate free-flowing pleural effusions bilaterally. There is mild bibasilar interstitial edema. There is atelectatic change in both lower lobes. There is no well-defined consolidation. On axial slice 43 series 7, there is a nodular opacity in the posterior segment of the left upper lobe measuring 4 x 4 mm. Upper Abdomen: There is reflux of contrast into the inferior vena cava and hepatic veins. Visualized upper abdominal structures otherwise appear unremarkable. Musculoskeletal: There is degenerative change in the thoracic spine. There is mild anterior wedging of several midthoracic vertebral bodies. No blastic or lytic bone lesions are  evident. Review of the MIP images confirms the above findings. IMPRESSION: 1.  No demonstrable pulmonary embolus. 2. Bilateral pleural effusions with bibasilar  interstitial edema and mild cardiomegaly. Suspect a degree of congestive heart failure. There is lower lobe atelectatic change but no frank airspace consolidation. 3.  Several enlarged lymph nodes of uncertain etiology. 4. 4 mm nodular opacity left upper lobe posterior segment. No follow-up needed if patient is low-risk. Non-contrast chest CT can be considered in 12 months if patient is high-risk. This recommendation follows the consensus statement: Guidelines for Management of Incidental Pulmonary Nodules Detected on CT Images: From the Fleischner Society 2017; Radiology 2017; 284:228-243. 5. Note that the contrast bolus in the aorta is not sufficient to exclude potential aortic dissection. No aneurysm seen in the thoracic region. If there is concern clinically for potential dissection, a repeat study with contrast bolus timed to optimize aortic visualization or MR with similar parameters would be needed to further assess. 6. Prominence of the main pulmonary outflow tract indicative of pulmonary arterial hypertension. 7. Reflux of contrast into the inferior vena cava and hepatic veins, a finding felt to be indicative of increased right heart pressure. Electronically Signed   By: Lowella Grip III M.D.   On: 09/23/2017 13:40    EKG  EKG Interpretation  Date/Time:  Tuesday September 23 2017 01:52:01 EST Ventricular Rate:  100 PR Interval:  168 QRS Duration: 104 QT Interval:  362 QTC Calculation: 466 R Axis:   61 Text Interpretation:  Normal sinus rhythm Septal infarct , age undetermined T wave abnormality, consider inferolateral ischemia Abnormal ECG When compared with ECG of 09/29/2012, Nonspecific T wave abnormality is now present Confirmed by Delora Fuel ([id number]) on 09/23/2017 2:05:57 AM Also confirmed by Delora Fuel [id number]), editor Hattie Perch ([id number])  on 09/23/2017 6:32:38 AM      Chest x-ray viewed by me Radiology Dg Chest 2 View  Result Date: 09/23/2017 CLINICAL DATA:  Dyspnea with fatigue. Finger and lower extremity edema onset yesterday. EXAM: CHEST  2 VIEW COMPARISON:  08/28/2017. FINDINGS: Enlarged but stable cardiac silhouette. Aortic atherosclerosis without aneurysm. Interstitial edema persists with small bilateral pleural effusions blunting the posterior costophrenic angles. Stable thoracic kyphosis with multilevel degenerative disc disease. IMPRESSION: Cardiomegaly with mild interstitial edema and small posterior pleural effusions. Electronically Signed   By: Ashley Royalty M.D.   On: 09/23/2017 02:46    Procedures Procedures (including critical care time)  Medications Ordered in ED Medications - No data to display  Chest x-ray viewed by me Initial Impression / Assessment and Plan / ED Course  I have reviewed the triage vital signs and the nursing notes.  Pertinent labs & imaging results that were available during my care of the patient were reviewed by me and considered in my medical decision making (see chart for details).     Hospitalist consulted.  Patient given Lasix intravenously ordered by me for gentle diuresis.  Will withhold nitrates in light of marginal blood pressure.  Hospitalist will arrange for overnight stay  Final Clinical Impressions(s) / ED Diagnoses  Dx #1 acute on chronic congestive heart failure #2 syncope #3 tobacco abuse Final diagnoses:  None    ED Discharge Orders    None       Orlie Dakin, MD 09/23/17 1439

## 2017-09-23 NOTE — ED Notes (Addendum)
Pt now stating to this RN he would like to have a conversation with the MD concerning the DNR.  Pt states he is having second thoughts. Admitting MD paged.

## 2017-09-23 NOTE — Consult Note (Signed)
Advanced Heart Failure Team Consult Note   Primary Physician:  None Primary Cardiologist:  Former patient of [facility] Dr Wyline Copas  Reason for Consultation: Heart Failure   HPI:    Christopher Meyers is seen today for evaluation of heart failure at the request of Dr Lorin Mercy ([facility])   Christopher Meyers is a 52 year old with a history of hepatitis C, barrett esophagus, COPD, hypothyroidism, smoker 1-2 cigarettes per day, chronic systolic heart failure due to severe NICM (EF 20-25%), and noncompliance.    He was last seen by NP at [facility] 08/27/2017 for HF follow up. He does not have a candidate for ICD due to ongoing noncompliance. Meds at the time wers supposed to be carvedilol 3.125 bid, lasix 40 daily and spiro 25 daily but he wasn't taking.   He says he lives alone with his girlfriend who he thinks is having an affair with the guy next door and is trying to poison him. Says he ahs been out of his meds for the last week or more.   Over past week or two increasing SOB and weakness. Unable to make ADLs or make it to mailbox without stopping to rest. Presented to ER for progressive HF symptoms. Smokes 1-2 cigs per day. Denies ETOH. Says his died dad at 58 due to a weak heart as well.    Today he presented to [facility] with increased dyspnea. Apparently he had been out of his lasix for weeks. Pertinent admission labs include BNP 2427, troponin 0.01, K 3.9, creatinine 1.09, and hgb 11. CXR with mild edema. Received 20 mg IV laisx in the ED.   Remains dyspneic talking.   CTA  09/23/2017.   1.  No demonstrable pulmonary embolus. 2. Bilateral pleural effusions with bibasilar interstitial edema and mild cardiomegaly. Suspect a degree of congestive heart failure. There is lower lobe atelectatic change but no frank airspace consolidation. 3.  Several enlarged lymph nodes of uncertain etiology. 4. 4 mm nodular opacity left upper lobe posterior segment. No follow-up needed if patient is low-risk. Non-contrast chest CT  can be considered in 12 months if patient is high-risk. This recommendation follows the consensus statement: Guidelines for Management of Incidental Pulmonary Nodules Detected on CT Images: From the Fleischner Society 2017; Radiology 2017; 284:228-243. 5. Note that the contrast bolus in the aorta is not sufficient to exclude potential aortic dissection. No aneurysm seen in the thoracic region. If there is concern clinically for potential dissection, a repeat study with contrast bolus timed to optimize aortic visualization or Christopher with similar parameters would be needed to further assess. 6. Prominence of the main pulmonary outflow tract indicative of pulmonary arterial hypertension. 7. Reflux of contrast into the inferior vena cava and hepatic veins, a finding felt to be indicative of increased right heart pressure.     Echo 2014 EF 35-40% [facility]  Review of Systems: [y] = yes, [ ]  = no   General: Weight gain [ ] ; Weight loss [ ] ; Anorexia Blue.Reese ]; Fatigue [Y ]; Fever [ ] ; Chills [ ] ; Weakness [ y]  Cardiac: Chest pain/pressure [ ] ; Resting SOB [Y ]; Exertional SOB [Y ]; Orthopnea [Y ]; Pedal Edema [ ] ; Palpitations [ ] ; Syncope [ ] ; Presyncope [Y ]; Paroxysmal nocturnal dyspnea[ ]   Pulmonary: Cough Jazmín.Cullens ]; Wheezing[ ] ; Hemoptysis[ ] ; Sputum [ ] ; Snoring [ ]   GI: Vomiting[ ] ; Dysphagia[ ] ; Melena[ ] ; Hematochezia [ ] ; Heartburn[ ] ; Abdominal pain [ ] ; Constipation [ ] ; Diarrhea [ ] ;  BRBPR [ ]   GU: Hematuria[ ] ; Dysuria [ ] ; Nocturia[ ]   Vascular: Pain in legs with walking [ ] ; Pain in feet with lying flat [ ] ; Non-healing sores [ ] ; Stroke [ ] ; TIA [ ] ; Slurred speech [ ] ;  Neuro: Headaches[ ] ; Vertigo[ ] ; Seizures[ ] ; Paresthesias[ ] ;Blurred vision [ ] ; Diplopia [ ] ; Vision changes [ ]   Ortho/Skin: Arthritis Blue.Reese ]; Joint pain [Y ]; Muscle pain [ ] ; Joint swelling [ ] ; Back Pain [ ] ; Rash [ ]   Psych: Depression[y ]; Anxiety[ ]   Heme: Bleeding problems [ ] ; Clotting disorders [ ] ; Anemia [ ]     Endocrine: Diabetes [ ] ; Thyroid dysfunction[Y ]  Home Medications Prior to Admission medications   Medication Sig Start Date End Date Taking? Authorizing Provider  aspirin EC 81 MG EC tablet Take 1 tablet (81 mg total) by mouth daily. 10/04/12  Yes Eugenie Filler, MD  atorvastatin (LIPITOR) 40 MG tablet Take 40 mg by mouth daily. 06/06/17  Yes [provider]  carvedilol (COREG) 3.125 MG tablet Take 1 tablet (3.125 mg total) by mouth 2 (two) times daily with a meal. 10/04/12  Yes Eugenie Filler, MD  furosemide (LASIX) 40 MG tablet Take 40 mg by mouth daily. 08/27/17 11/25/17 Yes [provider]  gabapentin (NEURONTIN) 300 MG capsule Take 300 mg by mouth 3 (three) times daily as needed. 06/06/17  Yes [provider]  levothyroxine (SYNTHROID, LEVOTHROID) 25 MCG tablet Take 25 mcg by mouth daily. 06/06/17  Yes [provider]  lisinopril (PRINIVIL,ZESTRIL) 2.5 MG tablet Take 2.5 mg by mouth daily.   Yes [provider]  albuterol (PROVENTIL HFA;VENTOLIN HFA) 108 (90 BASE) MCG/ACT inhaler Inhale 1-2 puffs into the lungs every 6 (six) hours as needed for wheezing or shortness of breath.    [provider]  antipyrine-benzocaine Toniann Fail) otic solution Place 2-4 drops into the right ear 4 (four) times daily as needed. Impacted cerumen    [provider]  famotidine (PEPCID) 20 MG tablet Take 1 tablet (20 mg total) by mouth 2 (two) times daily. Patient taking differently: Take 20 mg by mouth as needed for heartburn or indigestion.  08/16/14   Britt Bottom, NP  nitroGLYCERIN (NITROSTAT) 0.4 MG SL tablet Place 0.4 mg under the tongue every 5 (five) minutes as needed. Chest pain    [provider]  spironolactone (ALDACTONE) 25 MG tablet Take 1 tablet (25 mg total) by mouth daily. Patient not taking: Reported on 09/23/2017 10/04/12   Eugenie Filler, MD    Past Medical History: Past Medical History:  Diagnosis Date  .  Barrett's esophagus   . COPD (chronic obstructive pulmonary disease) ([place])   . Coronary artery disease   . Hepatitis   . Hypertension     Past Surgical History: Past Surgical History:  Procedure Laterality Date  . APPENDECTOMY    . CARDIAC CATHETERIZATION    . CORONARY STENT PLACEMENT    . LIVER BIOPSY      Family History: Family History  Problem Relation Age of Onset  . CAD Father   . Asthma Sister     Social History: Social History   Socioeconomic History  . Marital status: Legally Separated    Spouse name: None  . Number of children: None  . Years of education: None  . Highest education level: None  Social Needs  . Financial resource strain: None  . Food insecurity - worry: None  . Food insecurity - inability: None  .  Transportation needs - medical: None  . Transportation needs - non-medical: None  Occupational History  . None  Tobacco Use  . Smoking status: Heavy Tobacco Smoker    Types: Cigarettes  . Smokeless tobacco: Never Used  Substance and Sexual Activity  . Alcohol use: No  . Drug use: No  . Sexual activity: Not Currently  Other Topics Concern  . None  Social History Narrative  . None    Allergies:  No Known Allergies  Objective:    Vital Signs:   Temp:  [97.3 F (36.3 C)-97.8 F (36.6 C)] 97.3 F (36.3 C) (01/08 0951) Pulse Rate:  [94-110] 110 (01/08 1515) Resp:  [12-27] 24 (01/08 1515) BP: (95-119)/(70-96) 112/84 (01/08 1430) SpO2:  [97 %-100 %] 99 % (01/08 1515)    Weight change: There were no vitals filed for this visit.  Intake/Output:   Intake/Output Summary (Last 24 hours) at 09/23/2017 1518 Last data filed at 09/23/2017 1513 Gross per 24 hour  Intake -  Output 650 ml  Net -650 ml      Physical Exam    General:  Chronically ill.  Dypsneic  No resp difficulty HEENT: normal Neck: supple. JVP 9-10 . Carotids 2+ bilat; no bruits. No lymphadenopathy or thyromegaly appreciated. Cor: PMI  Laterally displaced. Tachy  regular. No rubs, or murmurs. +S3  Lungs: clear Abdomen: soft, nontender, + distended. No hepatosplenomegaly. No bruits or masses. Good bowel sounds. Extremities: no cyanosis, clubbing, rash, R and LLE 1+ edema Neuro: alert & orientedx3, cranial nerves grossly intact. moves all 4 extremities w/o difficulty. Affect pleasant   Telemetry   Sinus/ST 95-110 Personally reviewed   EKG     Sinus Tach 100 bpm IVCD 17ms Personally reviewed  Labs   Basic Metabolic Panel: Recent Labs  Lab 09/23/17 0207  NA 138  K 3.9  CL 110  CO2 17*  GLUCOSE 103*  BUN 30*  CREATININE 1.09  CALCIUM 8.6*    Liver Function Tests: No results for input(s): AST, ALT, ALKPHOS, BILITOT, PROT, ALBUMIN in the last 168 hours. No results for input(s): LIPASE, AMYLASE in the last 168 hours. No results for input(s): AMMONIA in the last 168 hours.  CBC: Recent Labs  Lab 09/23/17 0207  WBC 7.9  HGB 11.1*  HCT 36.1*  MCV 94.3  PLT 261    Cardiac Enzymes: No results for input(s): CKTOTAL, CKMB, CKMBINDEX, TROPONINI in the last 168 hours.  BNP: BNP (last 3 results) Recent Labs    09/23/17 1025  BNP 2,427.1*    ProBNP (last 3 results) No results for input(s): PROBNP in the last 8760 hours.   CBG: No results for input(s): GLUCAP in the last 168 hours.  Coagulation Studies: No results for input(s): LABPROT, INR in the last 72 hours.   Imaging   Dg Chest 2 View  Result Date: 09/23/2017 CLINICAL DATA:  Dyspnea with fatigue. Finger and lower extremity edema onset yesterday. EXAM: CHEST  2 VIEW COMPARISON:  08/28/2017. FINDINGS: Enlarged but stable cardiac silhouette. Aortic atherosclerosis without aneurysm. Interstitial edema persists with small bilateral pleural effusions blunting the posterior costophrenic angles. Stable thoracic kyphosis with multilevel degenerative disc disease. IMPRESSION: Cardiomegaly with mild interstitial edema and small posterior pleural effusions. Electronically  Signed   By: Ashley Royalty M.D.   On: 09/23/2017 02:46   Ct Angio Chest Pe W And/or Wo Contrast  Result Date: 09/23/2017 CLINICAL DATA:  Chest pain EXAM: CT ANGIOGRAPHY CHEST WITH CONTRAST TECHNIQUE: Multidetector CT imaging of the chest was  performed using the standard protocol during bolus administration of intravenous contrast. Multiplanar CT image reconstructions and MIPs were obtained to evaluate the vascular anatomy. CONTRAST:  80 mL Isovue 370 nonionic COMPARISON:  Chest CT September 30, 2012; chest radiograph September 23, 2017 FINDINGS: Cardiovascular: There is no demonstrable pulmonary embolus. There is no appreciable thoracic aortic aneurysm. The contrast bolus is not sufficient within the aorta to assess for potential dissection. Visualized great vessels appear unremarkable with limited contrast in these vessels. There is no appreciable pericardial effusion or pericardial thickening. Heart is mildly enlarged. There is prominence of the main pulmonary outflow tract with the main pulmonary outflow tract measuring 3.1 cm in diameter. Mediastinum/Nodes: Thyroid appears unremarkable. There is a lymph node anterior to the distal trachea on the right measuring 1.6 x 1.0 cm. There is a lymph node in the right hilar region measuring 1.6 x 1.5 cm. There is a lymph node in the subcarinal region measuring 1.5 x 1.3 cm. There is a lymph node in the aortopulmonary window measuring 1.5 x 1.4 cm. Several subcentimeter lymph nodes elsewhere noted. No esophageal lesions are evident. Lungs/Pleura: There are moderate free-flowing pleural effusions bilaterally. There is mild bibasilar interstitial edema. There is atelectatic change in both lower lobes. There is no well-defined consolidation. On axial slice 43 series 7, there is a nodular opacity in the posterior segment of the left upper lobe measuring 4 x 4 mm. Upper Abdomen: There is reflux of contrast into the inferior vena cava and hepatic veins. Visualized upper abdominal  structures otherwise appear unremarkable. Musculoskeletal: There is degenerative change in the thoracic spine. There is mild anterior wedging of several midthoracic vertebral bodies. No blastic or lytic bone lesions are evident. Review of the MIP images confirms the above findings. IMPRESSION: 1.  No demonstrable pulmonary embolus. 2. Bilateral pleural effusions with bibasilar interstitial edema and mild cardiomegaly. Suspect a degree of congestive heart failure. There is lower lobe atelectatic change but no frank airspace consolidation. 3.  Several enlarged lymph nodes of uncertain etiology. 4. 4 mm nodular opacity left upper lobe posterior segment. No follow-up needed if patient is low-risk. Non-contrast chest CT can be considered in 12 months if patient is high-risk. This recommendation follows the consensus statement: Guidelines for Management of Incidental Pulmonary Nodules Detected on CT Images: From the Fleischner Society 2017; Radiology 2017; 284:228-243. 5. Note that the contrast bolus in the aorta is not sufficient to exclude potential aortic dissection. No aneurysm seen in the thoracic region. If there is concern clinically for potential dissection, a repeat study with contrast bolus timed to optimize aortic visualization or Christopher with similar parameters would be needed to further assess. 6. Prominence of the main pulmonary outflow tract indicative of pulmonary arterial hypertension. 7. Reflux of contrast into the inferior vena cava and hepatic veins, a finding felt to be indicative of increased right heart pressure. Electronically Signed   By: Lowella Grip III M.D.   On: 09/23/2017 13:40      Medications:     Current Medications: . aspirin  81 mg Oral Daily  . atorvastatin  40 mg Oral Daily  . enoxaparin (LOVENOX) injection  40 mg Subcutaneous Q24H  . iopamidol      . levothyroxine  25 mcg Oral Daily  . [START ON 09/24/2017] lisinopril  2.5 mg Oral Daily     Infusions:     Patient  Profile  Christopher Meyers is a 52 year old with a history of hepatitis C, barrett esophagus,  NICM, COPD, hypothyroidism, smoker 1-2 cigarettes per day, chronic systolic heart failure, and noncompliance.  Admitted with volume overload in the setting of medication noncompliance.     Assessment/Plan   1. A/C Systolic Heart Failure due to severe NICM  - previous EF 20-25% Presumed NICM. He has had cath in the past at [facility]. Volume overloaded in the setting of medication noncompliance. Start 80 mg IV lasix twice daily. Follow renal function closely.  Hold off on beta blocker for now with acute decompensation.  No spiro or dig with noncompliance. Stop ace and start 12.5 mg losartan. Place PICC for co-ox  Repeat ECHO   2. COPD Need PCP.   3. Smoker Discussed smoking cessation  4. Noncompliance  Try to diurese over the next few days and optimize HF medications. Repeat ECHO. May need RHC once fully diuresed. For now place PICC.     He is at risk for readmission given poor insight related to heart failure.   Medication concerns reviewed with patient and pharmacy team. Barriers identified: He has hard time paying for medications.    Length of Stay: 0  Darrick Grinder, NP  09/23/2017, 3:18 PM  Advanced Heart Failure Team Pager [phone] (M-F; 7a - 4p)  Please contact [facility] for night-coverage after hours (4p -7a ) and weekends on [email or web address]  Patient seen and examined with Darrick Grinder, NP. We discussed all aspects of the encounter. I agree with the assessment and plan as stated above.   52 y/o male with severe NICM and EF 20-25% now presents with volume overload and Class IV HF with likely low output in the setting of recurrent medication noncompliance.   Diurese with IV lasix. Will start digoxin, spiro and losartan. Place PICC to assess for low output. Will likely not qualify for advanced therapies or ICD due to poor compliance and social support. May need Hospice involvement in the near future.  Repeat echo.   Glori Bickers, MD  4:58 PM

## 2017-09-23 NOTE — Progress Notes (Signed)
Peripherally Inserted Central Catheter/Midline Placement  The IV Nurse has discussed with the patient and/or persons authorized to consent for the patient, the purpose of this procedure and the potential benefits and risks involved with this procedure.  The benefits include less needle sticks, lab draws from the catheter, and the patient may be discharged home with the catheter. Risks include, but not limited to, infection, bleeding, blood clot (thrombus formation), and puncture of an artery; nerve damage and irregular heartbeat and possibility to perform a PICC exchange if needed/ordered by physician.  Alternatives to this procedure were also discussed.  Bard Power PICC patient education guide, fact sheet on infection prevention and patient information card has been provided to patient /or left at bedside.    PICC/Midline Placement Documentation        Darlyn Read 09/23/2017, 6:24 PM

## 2017-09-23 NOTE — ED Notes (Signed)
Ordered pt a heart healthy meal tray.

## 2017-09-23 NOTE — ED Triage Notes (Signed)
Patient reports SOB with fatigue , lower legs/fingers edema onset yesterday , denies fever or chills .
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
## 2017-09-23 NOTE — ED Notes (Signed)
Spoke with admitting MD, pt will remain a Full Code status.

## 2017-09-23 NOTE — ED Notes (Signed)
Dr Bensimone at bedside. 

## 2017-09-23 NOTE — ED Notes (Signed)
Pt's ex-wife Star Shown [phone].

## 2017-09-24 ENCOUNTER — Other Ambulatory Visit: Payer: Self-pay

## 2017-09-24 ENCOUNTER — Observation Stay (HOSPITAL_BASED_OUTPATIENT_CLINIC_OR_DEPARTMENT_OTHER): Payer: Medicaid Other

## 2017-09-24 DIAGNOSIS — I429 Cardiomyopathy, unspecified: Secondary | ICD-10-CM | POA: Diagnosis present

## 2017-09-24 DIAGNOSIS — D638 Anemia in other chronic diseases classified elsewhere: Secondary | ICD-10-CM | POA: Diagnosis present

## 2017-09-24 DIAGNOSIS — D751 Secondary polycythemia: Secondary | ICD-10-CM | POA: Diagnosis present

## 2017-09-24 DIAGNOSIS — Z955 Presence of coronary angioplasty implant and graft: Secondary | ICD-10-CM | POA: Diagnosis not present

## 2017-09-24 DIAGNOSIS — Z8249 Family history of ischemic heart disease and other diseases of the circulatory system: Secondary | ICD-10-CM | POA: Diagnosis not present

## 2017-09-24 DIAGNOSIS — Z9114 Patient's other noncompliance with medication regimen: Secondary | ICD-10-CM | POA: Diagnosis not present

## 2017-09-24 DIAGNOSIS — Z8619 Personal history of other infectious and parasitic diseases: Secondary | ICD-10-CM

## 2017-09-24 DIAGNOSIS — R0602 Shortness of breath: Secondary | ICD-10-CM | POA: Diagnosis not present

## 2017-09-24 DIAGNOSIS — Z79890 Hormone replacement therapy (postmenopausal): Secondary | ICD-10-CM | POA: Diagnosis not present

## 2017-09-24 DIAGNOSIS — Z825 Family history of asthma and other chronic lower respiratory diseases: Secondary | ICD-10-CM | POA: Diagnosis not present

## 2017-09-24 DIAGNOSIS — J449 Chronic obstructive pulmonary disease, unspecified: Secondary | ICD-10-CM | POA: Diagnosis not present

## 2017-09-24 DIAGNOSIS — F419 Anxiety disorder, unspecified: Secondary | ICD-10-CM | POA: Diagnosis present

## 2017-09-24 DIAGNOSIS — F17210 Nicotine dependence, cigarettes, uncomplicated: Secondary | ICD-10-CM | POA: Diagnosis present

## 2017-09-24 DIAGNOSIS — I5023 Acute on chronic systolic (congestive) heart failure: Secondary | ICD-10-CM | POA: Diagnosis present

## 2017-09-24 DIAGNOSIS — R591 Generalized enlarged lymph nodes: Secondary | ICD-10-CM | POA: Diagnosis present

## 2017-09-24 DIAGNOSIS — R296 Repeated falls: Secondary | ICD-10-CM | POA: Diagnosis present

## 2017-09-24 DIAGNOSIS — I2510 Atherosclerotic heart disease of native coronary artery without angina pectoris: Secondary | ICD-10-CM | POA: Diagnosis present

## 2017-09-24 DIAGNOSIS — K2270 Barrett's esophagus without dysplasia: Secondary | ICD-10-CM | POA: Diagnosis not present

## 2017-09-24 DIAGNOSIS — I361 Nonrheumatic tricuspid (valve) insufficiency: Secondary | ICD-10-CM | POA: Diagnosis not present

## 2017-09-24 DIAGNOSIS — E039 Hypothyroidism, unspecified: Secondary | ICD-10-CM | POA: Diagnosis present

## 2017-09-24 DIAGNOSIS — B1920 Unspecified viral hepatitis C without hepatic coma: Secondary | ICD-10-CM | POA: Diagnosis present

## 2017-09-24 DIAGNOSIS — Z79899 Other long term (current) drug therapy: Secondary | ICD-10-CM | POA: Diagnosis not present

## 2017-09-24 DIAGNOSIS — E785 Hyperlipidemia, unspecified: Secondary | ICD-10-CM | POA: Diagnosis present

## 2017-09-24 DIAGNOSIS — G4700 Insomnia, unspecified: Secondary | ICD-10-CM | POA: Diagnosis present

## 2017-09-24 DIAGNOSIS — I428 Other cardiomyopathies: Secondary | ICD-10-CM | POA: Diagnosis not present

## 2017-09-24 DIAGNOSIS — Z7982 Long term (current) use of aspirin: Secondary | ICD-10-CM | POA: Diagnosis not present

## 2017-09-24 DIAGNOSIS — I5043 Acute on chronic combined systolic (congestive) and diastolic (congestive) heart failure: Secondary | ICD-10-CM | POA: Diagnosis not present

## 2017-09-24 DIAGNOSIS — J431 Panlobular emphysema: Secondary | ICD-10-CM | POA: Diagnosis present

## 2017-09-24 DIAGNOSIS — I110 Hypertensive heart disease with heart failure: Secondary | ICD-10-CM | POA: Diagnosis present

## 2017-09-24 DIAGNOSIS — J9601 Acute respiratory failure with hypoxia: Secondary | ICD-10-CM | POA: Diagnosis present

## 2017-09-24 LAB — BASIC METABOLIC PANEL
Anion gap: 9 (ref 5–15)
BUN: 25 mg/dL — AB (ref 6–20)
CALCIUM: 8.3 mg/dL — AB (ref 8.9–10.3)
CO2: 25 mmol/L (ref 22–32)
CREATININE: 1.17 mg/dL (ref 0.61–1.24)
Chloride: 103 mmol/L (ref 101–111)
GFR calc Af Amer: >60 mL/min (ref >60)
Glucose, Bld: 91 mg/dL (ref 65–99)
Potassium: 3.8 mmol/L (ref 3.5–5.1)
SODIUM: 137 mmol/L (ref 135–145)

## 2017-09-24 LAB — COOXEMETRY PANEL
CARBOXYHEMOGLOBIN: 1.1 % (ref 0.5–1.5)
METHEMOGLOBIN: 0.9 % (ref 0.0–1.5)
O2 Saturation: 55.2 %
TOTAL HEMOGLOBIN: 14.4 g/dL (ref 12.0–16.0)

## 2017-09-24 LAB — ECHOCARDIOGRAM COMPLETE: Weight: 2096 oz

## 2017-09-24 MED ORDER — SODIUM CHLORIDE 0.9 % IV SOLN
510.0000 mg | INTRAVENOUS | Status: DC
  Administered 2017-09-24: 510 mg via INTRAVENOUS
  Filled 2017-09-24: qty 17

## 2017-09-24 MED ORDER — FUROSEMIDE 40 MG PO TABS
40.0000 mg | ORAL_TABLET | Freq: Every day | ORAL | Status: DC
  Administered 2017-09-24: 40 mg via ORAL
  Filled 2017-09-24 (×2): qty 1

## 2017-09-24 MED ORDER — ALBUTEROL SULFATE (2.5 MG/3ML) 0.083% IN NEBU: 2.5000 mg | INHALATION_SOLUTION | Freq: Three times a day (TID) | RESPIRATORY_TRACT | Status: DC

## 2017-09-24 MED ORDER — LEVALBUTEROL HCL 0.63 MG/3ML IN NEBU
0.6300 mg | INHALATION_SOLUTION | Freq: Three times a day (TID) | RESPIRATORY_TRACT | Status: DC
  Administered 2017-09-24: 0.63 mg via RESPIRATORY_TRACT
  Filled 2017-09-24: qty 3

## 2017-09-24 MED ORDER — LEVALBUTEROL HCL 0.63 MG/3ML IN NEBU
0.6300 mg | INHALATION_SOLUTION | Freq: Three times a day (TID) | RESPIRATORY_TRACT | Status: DC
  Administered 2017-09-25 – 2017-09-26 (×4): 0.63 mg via RESPIRATORY_TRACT
  Filled 2017-09-24 (×4): qty 3

## 2017-09-24 MED ORDER — FUROSEMIDE 40 MG PO TABS: 40.0000 mg | ORAL_TABLET | Freq: Every day | ORAL | Status: DC

## 2017-09-24 NOTE — Care Management Note (Signed)
Case Management Note  Patient Details  Name: Christopher Meyers MRN: [id number] Date of Birth: 08-Aug-1966  Subjective/Objective:   COPD                Action/Plan: CM attempted to talk to patient but he is very sleepy at this time. CM to follow up  Expected Discharge Date:  09/26/17               Expected Discharge Plan:   possibly home with [facility]  Status of Service:   In progress  [name] [phone] 09/24/2017, 11:08 AM
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
## 2017-09-24 NOTE — Progress Notes (Signed)
  Echocardiogram 2D Echocardiogram has been performed.  Christopher Meyers 09/24/2017, 1:51 PM
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
## 2017-09-24 NOTE — Progress Notes (Signed)
Pt sound asleep on my arrival, difficult to arouse. RN came to start Fe infusion, pt still drowsy, not able to open his eyes. Will allow to rest today and start Fe, will f/u tomorrow. [place], ACSM 2:53 PM 09/24/2017

## 2017-09-25 DIAGNOSIS — K2270 Barrett's esophagus without dysplasia: Secondary | ICD-10-CM

## 2017-09-25 LAB — CBC WITH DIFFERENTIAL/PLATELET
BASOS ABS: 0 10*3/uL (ref 0.0–0.1)
BASOS PCT: 0 %
Eosinophils Absolute: 0.3 10*3/uL (ref 0.0–0.7)
Eosinophils Relative: 4 %
HEMATOCRIT: 40.4 % (ref 39.0–52.0)
HEMOGLOBIN: 12.6 g/dL — AB (ref 13.0–17.0)
Lymphocytes Relative: 28 %
Lymphs Abs: 2.2 10*3/uL (ref 0.7–4.0)
MCH: 29.3 pg (ref 26.0–34.0)
MCHC: 31.2 g/dL (ref 30.0–36.0)
MCV: 94 fL (ref 78.0–100.0)
MONOS PCT: 9 %
Monocytes Absolute: 0.7 10*3/uL (ref 0.1–1.0)
NEUTROS ABS: 4.7 10*3/uL (ref 1.7–7.7)
NEUTROS PCT: 59 %
Platelets: 280 10*3/uL (ref 150–400)
RBC: 4.3 MIL/uL (ref 4.22–5.81)
RDW: 15.8 % — ABNORMAL HIGH (ref 11.5–15.5)
WBC: 7.9 10*3/uL (ref 4.0–10.5)

## 2017-09-25 LAB — BASIC METABOLIC PANEL
Anion gap: 5 (ref 5–15)
BUN: 21 mg/dL — ABNORMAL HIGH (ref 6–20)
CALCIUM: 8.4 mg/dL — AB (ref 8.9–10.3)
CO2: 28 mmol/L (ref 22–32)
CREATININE: 1.06 mg/dL (ref 0.61–1.24)
Chloride: 105 mmol/L (ref 101–111)
Glucose, Bld: 123 mg/dL — ABNORMAL HIGH (ref 65–99)
Potassium: 3.9 mmol/L (ref 3.5–5.1)
SODIUM: 138 mmol/L (ref 135–145)

## 2017-09-25 MED ORDER — FUROSEMIDE 40 MG PO TABS
40.0000 mg | ORAL_TABLET | Freq: Every day | ORAL | Status: DC
  Administered 2017-09-26: 40 mg via ORAL
  Filled 2017-09-25: qty 1

## 2017-09-25 MED ORDER — FUROSEMIDE 10 MG/ML IJ SOLN
60.0000 mg | Freq: Once | INTRAMUSCULAR | Status: AC
  Administered 2017-09-25: 60 mg via INTRAVENOUS
  Filled 2017-09-25: qty 6

## 2017-09-25 NOTE — Plan of Care (Signed)
  Nutrition: Adequate nutrition will be maintained 09/25/2017 0433 - Completed/Met by Evert Kohl, RN   Coping: Level of anxiety will decrease 09/25/2017 0433 - Completed/Met by Evert Kohl, RN   Pain Managment: General experience of comfort will improve 09/25/2017 0443 - Completed/Met by Evert Kohl, RN
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
## 2017-09-25 NOTE — Plan of Care (Signed)
  Nutrition: Adequate nutrition will be maintained 09/25/2017 0433 - Completed/Met by Evert Kohl, RN   Coping: Level of anxiety will decrease 09/25/2017 0433 - Completed/Met by Evert Kohl, RN
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
## 2017-09-26 LAB — BASIC METABOLIC PANEL
Anion gap: 7 (ref 5–15)
BUN: 24 mg/dL — AB (ref 6–20)
CHLORIDE: 101 mmol/L (ref 101–111)
CO2: 28 mmol/L (ref 22–32)
CREATININE: 0.97 mg/dL (ref 0.61–1.24)
Calcium: 8.9 mg/dL (ref 8.9–10.3)
GFR calc Af Amer: >60 mL/min (ref >60)
GLUCOSE: 95 mg/dL (ref 65–99)
Potassium: 3.8 mmol/L (ref 3.5–5.1)
SODIUM: 136 mmol/L (ref 135–145)

## 2017-09-26 MED ORDER — SODIUM CHLORIDE 0.9 % IV BOLUS (SEPSIS)
250.0000 mL | Freq: Once | INTRAVENOUS | Status: AC
  Administered 2017-09-26: 250 mL via INTRAVENOUS

## 2017-09-26 MED ORDER — SODIUM CHLORIDE 0.9 % IV BOLUS (SEPSIS): 250.0000 mL | Freq: Once | INTRAVENOUS | Status: DC

## 2017-09-26 MED ORDER — LEVALBUTEROL HCL 0.63 MG/3ML IN NEBU
0.6300 mg | INHALATION_SOLUTION | Freq: Two times a day (BID) | RESPIRATORY_TRACT | Status: DC
  Administered 2017-09-26 – 2017-09-27 (×2): 0.63 mg via RESPIRATORY_TRACT
  Filled 2017-09-26 (×2): qty 3

## 2017-09-26 NOTE — Clinical Social Work Placement (Signed)
   CLINICAL SOCIAL WORK PLACEMENT  NOTE  Date:  09/26/2017  Patient Details  Name: Christopher Meyers MRN: [id number] Date of Birth: 07/21/1966  Clinical Social Work is seeking post-discharge placement for this patient at the [place] level of care (*CSW will initial, date and re-position this form in  chart as items are completed):  Yes   Patient/family provided with [facility] Work Department's list of facilities offering this level of care within the geographic area requested by the patient (or if unable, by the patient's family).  Yes   Patient/family informed of their freedom to choose among providers that offer the needed level of care, that participate in Medicare, Medicaid or managed care program needed by the patient, have an available bed and are willing to accept the patient.  Yes   Patient/family informed of [facility]'s ownership interest in [facility] and [facility], as well as of the fact that they are under no obligation to receive care at these facilities.  PASRR submitted to EDS on 09/26/17     PASRR number received on 09/26/17     Existing PASRR number confirmed on       FL2 transmitted to all facilities in geographic area requested by pt/family on 09/26/17     FL2 transmitted to all facilities within larger geographic area on       Patient informed that his/her managed care company has contracts with or will negotiate with certain facilities, including the following:            Patient/family informed of bed offers received.  Patient chooses bed at       Physician recommends and patient chooses bed at      Patient to be transferred to   on  .  Patient to be transferred to facility by       Patient family notified on   of transfer.  Name of family member notified:        PHYSICIAN Please sign FL2     Additional Comment:    _______________________________________________ Candie Chroman, LCSW 09/26/2017, 2:28  PM

## 2017-09-26 NOTE — Plan of Care (Signed)
  Progressing Education: Knowledge of General Education information will improve 09/26/2017 0054 - Progressing by Theador Hawthorne, RN Health Behavior/Discharge Planning: Ability to manage health-related needs will improve 09/26/2017 0054 - Progressing by Theador Hawthorne, RN Clinical Measurements: Ability to maintain clinical measurements within normal limits will improve 09/26/2017 0054 - Progressing by Theador Hawthorne, RN Will remain free from infection 09/26/2017 0054 - Progressing by Theador Hawthorne, RN Cardiovascular complication will be avoided 09/26/2017 0054 - Progressing by Theador Hawthorne, RN Activity: Risk for activity intolerance will decrease 09/26/2017 0054 - Progressing by Theador Hawthorne, RN Elimination: Will not experience complications related to bowel motility 09/26/2017 0054 - Progressing by Theador Hawthorne, RN Will not experience complications related to urinary retention 09/26/2017 0054 - Progressing by Theador Hawthorne, RN Safety: Ability to remain free from injury will improve 09/26/2017 0054 - Progressing by Theador Hawthorne, RN Education: Ability to demonstrate management of disease process will improve 09/26/2017 0054 - Progressing by Theador Hawthorne, RN Ability to verbalize understanding of medication therapies will improve 09/26/2017 0054 - Progressing by Theador Hawthorne, RN Activity: Capacity to carry out activities will improve 09/26/2017 0054 - Progressing by Theador Hawthorne, RN Cardiac: Ability to achieve and maintain adequate cardiopulmonary perfusion will improve 09/26/2017 0054 - Progressing by Theador Hawthorne, RN

## 2017-09-26 NOTE — Evaluation (Signed)
Occupational Therapy Evaluation Patient Details Name: Christopher Meyers MRN: [id number] DOB: 12-25-1965 Today's Date: 09/26/2017    History of Present Illness Pt is a 52 y.o. male admitted 09/23/17 with SOB, BLE edema, and progressive weakness. CXR consistent with pulmonary edema. Echo 1/9 shows EF 25%. PMH includes HTN, CHF, CAD, COPD, tobacco abuse, medical noncompliance.   Clinical Impression   This 52 yo male admitted with above presents to acute OT with decreased balance and pain both affecting his safety and independence with basic ADLs. He reports very tired after going to bathroom and back with me. Prior to activity sats 96% on RA and HR 102; post activity sats 94% on RA and HR 103. Pt will benefit from continued acute OT with follow up OT at SNF.     Follow Up Recommendations  SNF;Supervision/Assistance - 24 hour    Equipment Recommendations  Other (comment)(TDB at next venue)       Precautions / Restrictions Precautions Precautions: Fall Restrictions Weight Bearing Restrictions: No      Mobility Bed Mobility Overal bed mobility: Needs Assistance Bed Mobility: Supine to Sit;Sit to Supine     Supine to sit: Supervision Sit to supine: Supervision      Transfers Overall transfer level: Needs assistance Equipment used: Rolling walker (2 wheeled) Transfers: Sit to/from Stand Sit to Stand: Min guard         General transfer comment:  Educated on hand placement with RW     Balance Overall balance assessment: Needs assistance   Sitting balance-Leahy Scale: Good       Standing balance-Leahy Scale: Poor Standing balance comment: Reliant on UE support                           ADL either performed or assessed with clinical judgement   ADL Overall ADL's : Needs assistance/impaired Eating/Feeding: Independent;Sitting   Grooming: Min guard;Standing   Upper Body Bathing: Set up;Sitting   Lower Body Bathing: Min guard;Sit to/from stand   Upper Body  Dressing : Set up;Sitting   Lower Body Dressing: Min guard;Sit to/from stand   Toilet Transfer: Min guard;Ambulation;Regular Toilet;Grab bars;RW   Toileting- Water quality scientist and Hygiene: Min guard;Sit to/from stand               Vision Patient Visual Report: No change from baseline              Pertinent Vitals/Pain Pain Assessment: Faces Faces Pain Scale: Hurts little more Pain Location: R foot; upper right back; generalized Pain Descriptors / Indicators: Discomfort;Sore Pain Intervention(s): Monitored during session;Repositioned;Limited activity within patient's tolerance(heat applied back)     Hand Dominance  right   Extremity/Trunk Assessment Upper Extremity Assessment Upper Extremity Assessment: Overall WFL for tasks assessed   Lower Extremity Assessment Lower Extremity Assessment: Defer to PT evaluation       Communication Communication Communication: No difficulties   Cognition Arousal/Alertness: Awake/alert Behavior During Therapy: Flat affect Overall Cognitive Status: No family/caregiver present to determine baseline cognitive functioning Area of Impairment: Attention;Safety/judgement;Awareness                   Current Attention Level: Selective     Safety/Judgement: Decreased awareness of safety;Decreased awareness of deficits Awareness: Emergent   General Comments: Pt demonstrating poor insight into current condition and deficits              Home Living Family/patient expects to be discharged to:: Private residence Living Arrangements: Non-relatives/Friends  Available Help at Discharge: Friend(s);Available PRN/intermittently                             Additional Comments: Pt unsure where he plans to d/c. Reports he has been living with friends; not forthcoming with details when asked      Prior Functioning/Environment Level of Independence: Independent with assistive device(s)        Comments: Mod indep  with intermittent use of SPC        OT Problem List: Decreased activity tolerance;Impaired balance (sitting and/or standing);Pain      OT Treatment/Interventions: Self-care/ADL training;Balance training;DME and/or AE instruction;Patient/family education    OT Goals(Current goals can be found in the care plan section) Acute Rehab OT Goals Patient Stated Goal: go to rehab to get stronger OT Goal Formulation: With patient Time For Goal Achievement: 10/10/17 Potential to Achieve Goals: Good  OT Frequency: Min 2X/week   Barriers to D/C: Decreased caregiver support             AM-PAC PT "6 Clicks" Daily Activity     Outcome Measure Help from another person eating meals?: None Help from another person taking care of personal grooming?: A Little Help from another person toileting, which includes using toliet, bedpan, or urinal?: A Little Help from another person bathing (including washing, rinsing, drying)?: A Little Help from another person to put on and taking off regular upper body clothing?: A Little Help from another person to put on and taking off regular lower body clothing?: A Little 6 Click Score: 19   End of Session Equipment Utilized During Treatment: Rolling walker  Activity Tolerance: Patient limited by fatigue Patient left: in bed;with call bell/phone within reach;with bed alarm set  OT Visit Diagnosis: Unsteadiness on feet (R26.81);Pain Pain - Right/Left: Right Pain - part of body: (right upper back)                Time: 4888-9169 OT Time Calculation (min): 10 min Charges:  OT General Charges $OT Visit: 1 Visit OT Evaluation $OT Eval Moderate Complexity: [address] [place] [phone] 09/26/2017

## 2017-09-26 NOTE — Clinical Social Work Note (Signed)
Clinical Social Work Assessment  Patient Details  Name: Christopher Meyers MRN: [id number] Date of Birth: [birth year]  Date of referral:  09/26/17               Reason for consult:  Facility Placement, Discharge Planning                Permission sought to share information with:  Chartered certified accountant granted to share information::  Yes, Verbal Permission Granted  Name::        Agency::  SNF's  Relationship::     Contact Information:     Housing/Transportation Living arrangements for the past 2 months:  Single Family Home Source of Information:  Patient, Medical Team Patient Interpreter Needed:  None Criminal Activity/Legal Involvement Pertinent to Current Situation/Hospitalization:  No - Comment as needed Significant Relationships:  Friend Lives with:  Friends Do you feel safe going back to the place where you live?  Yes Need for family participation in patient care:  Yes (Comment)  Care giving concerns:  PT recommending SNF once medically stable for discharge.   Social Worker assessment / plan:  CSW met with patient. No supports at bedside. CSW introduced role and explained that PT recommendations would be discussed. Patient agreeable to SNF and aware of Medicaid conditions to SNF placement: His Medicaid check minus $30 will go directly to the facility and he has to stay a minimum of 30 days for Medicaid to pay. He does not have a preference on facility. CSW provided SNF list for review. No further concerns. CSW encouraged patient to contact CSW as needed. CSW will continue to follow patient for support and facilitate discharge to SNF once medically stable.  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 Patient's friend supportive and involved in  patient's care. Patient appreciated social work intervention.  Patient/Family's Understanding of and Emotional Response to Diagnosis, Current Treatment, and Prognosis:  Patient has a good understanding of the reason for admission and his need for rehab prior to returning home. Patient appears happy with hospital care.  Emotional Assessment Appearance:  Appears stated age Attitude/Demeanor/Rapport:  Lethargic Affect (typically observed):  Accepting, Appropriate, Calm, Pleasant Orientation:  Oriented to Self, Oriented to Place, Oriented to  Time, Oriented to Situation Alcohol / Substance use:  Tobacco Use Psych involvement (Current and /or in the community):  No (Comment)  Discharge Needs  Concerns to be addressed:  Care Coordination Readmission within the last 30 days:  No Current discharge risk:  Dependent with Mobility Barriers to Discharge:  Continued Medical Work up   Candie Chroman, LCSW 09/26/2017, 2:25 PM

## 2017-09-26 NOTE — Care Management Note (Signed)
Case Management Note  Patient Details  Name: Christopher Meyers MRN: [id number] Date of Birth: 15-Feb-1966  Subjective/Objective:     COPD              Action/Plan: Patient lives at home; has Medicaid for medical insurance with prescription drug coverage; PCP assigned to is [place] but patient stated that the office is too far away for him to visit and stated that he will contact Medicaid to have it changed. He stated that he was recently at [facility] and they wanted to send him to a nursing facility for rehab but refused, now he is requesting to go now; Physical Therapy eval placed; CM will continue to follow for progression of care.  Expected Discharge Date:  09/26/17               Expected Discharge Plan:  [place] possibly  Discharge planning Services  CM Consult  Status of Service:  In process, will continue to follow  [name] [phone] 09/26/2017, 12:15 PM

## 2017-09-26 NOTE — Evaluation (Signed)
Physical Therapy Evaluation Patient Details Name: Christopher Meyers MRN: [id number] DOB: [birth year] Today's Date: 09/26/2017   History of Present Illness  Pt is a 52 y.o. male admitted 09/23/17 with SOB, BLE edema, and progressive weakness. CXR consistent with pulmonary edema. Echo 1/9 shows EF 25%. PMH includes HTN, CHF, CAD, COPD, tobacco abuse, medical noncompliance.    Clinical Impression  Pt presents with decreased activity tolerance and an overall decrease in functional mobility secondary to above. Pt not forthcoming with details when asked about PLOF and home set-up, but reports indep with intermittent use of SPC; notes significant increase in falls at home recently. Today, required min guard to minA for transfers and amb short distance with RW. Pt limited by fatigue and DOE, declining further mobility. Would benefit from SNF-level therapies to maximize functional mobility, activity tolerance, and independence prior to return home. Will continue to follow acutely to address established goals.    Follow Up Recommendations SNF    Equipment Recommendations  Rolling walker with 5" wheels    Recommendations for Other Services OT consult     Precautions / Restrictions Precautions Precautions: Fall Restrictions Weight Bearing Restrictions: No      Mobility  Bed Mobility Overal bed mobility: Needs Assistance Bed Mobility: Supine to Sit;Sit to Supine     Supine to sit: Supervision Sit to supine: Supervision      Transfers Overall transfer level: Needs assistance Equipment used: Rolling walker (2 wheeled) Transfers: Sit to/from Stand Sit to Stand: Min guard;Min assist         General transfer comment: Min guard for balance; educ on safe technique with RW, but pt adament about pulling with BUEs on RW. MinA to steady walker  Ambulation/Gait Ambulation/Gait assistance: Min guard Ambulation Distance (Feet): 30 Feet Assistive device: Rolling walker (2 wheeled) Gait  Pattern/deviations: Step-through pattern;Decreased stride length;Trunk flexed;Antalgic Gait velocity: Decreased Gait velocity interpretation: <1.8 ft/sec, indicative of risk for recurrent falls General Gait Details: Slow, unsteady amb with RW and close min guard for balance. Pt with DOE 3/4 stating "I have to sit, I have to sit" upon return to room secondary to fatigue and SOB  Stairs            Wheelchair Mobility    Modified Rankin (Stroke Patients Only)       Balance Overall balance assessment: Needs assistance   Sitting balance-Leahy Scale: Good       Standing balance-Leahy Scale: Poor Standing balance comment: Reliant on UE support                             Pertinent Vitals/Pain Pain Assessment: Faces Faces Pain Scale: Hurts little more Pain Location: R foot; generalized Pain Descriptors / Indicators: Discomfort;Sore Pain Intervention(s): Limited activity within patient's tolerance;Monitored during session    Christopher Meyers expects to be discharged to:: Private residence Living Arrangements: Non-relatives/Friends Available Help at Discharge: Friend(s);Available PRN/intermittently             Additional Comments: Pt unsure where he plans to d/c. Reports he has been living with friends; not forthcoming with details when asked    Prior Function Level of Independence: Independent with assistive device(s)         Comments: Mod indep with intermittent use of SPC     Hand Dominance        Extremity/Trunk Assessment   Upper Extremity Assessment Upper Extremity Assessment: Generalized weakness    Lower Extremity Assessment Lower  Extremity Assessment: Generalized weakness       Communication   Communication: No difficulties  Cognition Arousal/Alertness: Awake/alert Behavior During Therapy: Flat affect Overall Cognitive Status: No family/caregiver present to determine baseline cognitive functioning Area of Impairment:  Attention;Safety/judgement;Awareness                   Current Attention Level: Selective     Safety/Judgement: Decreased awareness of safety;Decreased awareness of deficits Awareness: Emergent   General Comments: Pt demonstrating poor insight into current condition and deficits      General Comments      Exercises     Assessment/Plan    PT Assessment Patient needs continued PT services  PT Problem List Decreased strength;Decreased activity tolerance;Decreased balance;Decreased mobility;Decreased cognition;Decreased knowledge of use of DME;Cardiopulmonary status limiting activity       PT Treatment Interventions DME instruction;Gait training;Functional mobility training;Stair training;Therapeutic activities;Therapeutic exercise;Balance training;Patient/family education    PT Goals (Current goals can be found in the Care Plan section)  Acute Rehab PT Goals Patient Stated Goal: Get stronger at rehab facility PT Goal Formulation: With patient Time For Goal Achievement: 10/10/17 Potential to Achieve Goals: Good    Frequency Min 2X/week   Barriers to discharge        Co-evaluation               AM-PAC PT "6 Clicks" Daily Activity  Outcome Measure Difficulty turning over in bed (including adjusting bedclothes, sheets and blankets)?: A Little Difficulty moving from lying on back to sitting on the side of the bed? : A Little Difficulty sitting down on and standing up from a chair with arms (e.g., wheelchair, bedside commode, etc,.)?: Unable Help needed moving to and from a bed to chair (including a wheelchair)?: A Little Help needed walking in hospital room?: A Little Help needed climbing 3-5 steps with a railing? : A Lot 6 Click Score: 15    End of Session Equipment Utilized During Treatment: Gait belt Activity Tolerance: Patient limited by fatigue;Patient limited by pain Patient left: in bed;with call bell/phone within reach;with bed alarm set Nurse  Communication: Mobility status PT Visit Diagnosis: Other abnormalities of gait and mobility (R26.89)    Time: 1254-1310 PT Time Calculation (min) (ACUTE ONLY): 16 min   Charges:   PT Evaluation $PT Eval Moderate Complexity: 1 Mod     PT G Codes:       Christopher Meyers, PT, DPT Acute Rehab Services  Pager: [place] 09/26/2017, 1:26 PM
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
## 2017-09-26 NOTE — Progress Notes (Signed)
CARDIAC REHAB PHASE I   PRE:  Rate/Rhythm: 107 ST    BP: sitting 103/72    SaO2: 100 RA  MODE:  Ambulation: 20 ft   POST:  Rate/Rhythm: 117 ST    BP: sitting 91/64     SaO2: 95 RA  Pt willing to walk but c/o nausea with standing. Used RW and gait belt, assist x1. Wanted to sit in hall due to fatigue after 8 ft. No chair available so took a couple more steps then turned around. Legs dragging/lethargic. Once pt saw the chair by the door, he sat in it without warning. A linen bag was in it so he sat on edge and almost slipped out but I had gait belt on him. Discussed importance of safety and communication with pt.  He was not receptive. He sts he wants to walk to snack machine. Once pt walked to bed his BP was 91/64. Pt sts his friends won't bring him crackers and peanut butter. Put on bed alarm and gave peanut butter and crackers. [place], ACSM 09/26/2017 2:54 PM

## 2017-09-26 NOTE — Progress Notes (Signed)
Advanced Heart Failure Rounding Note  PCP:  Primary Cardiologist: Dr Wyline Copas  Subjective:    Received extra 60 mg IV lasix yesterday. - 1.6 liters, weight down 3 lbs. Co-ox 1/9 55%. + Orthostatics 102/67, 93 > 93/78, 116.   SOB has improved, but still getting SOB when up and walking in halls. Less coughing. No CP. HOB 30 degree and pt appears comfortable. Walked with cardiac rehab and c/o dizziness yesterday. Still dizzy with standing up today.  Has been seen by PT. Recommending SNF/ He is ok with this.   UDS + for amphetamines but adamantly denies meth use.   Echo 09/24/17 EF 25% RV mild to moderately down.   Objective:   Weight Range: 128 lb 4.8 oz (58.2 kg) Body mass index is 18.95 kg/m.   Vital Signs:   Temp:  [97.7 F (36.5 C)-98 F (36.7 C)] 98 F (36.7 C) (01/11 0605) Pulse Rate:  [90-108] 90 (01/11 0805) Resp:  [16-20] 16 (01/11 0805) BP: (90-107)/(61-73) 90/64 (01/11 0605) SpO2:  [94 %-100 %] 94 % (01/11 0805) Weight:  [128 lb 4.8 oz (58.2 kg)] 128 lb 4.8 oz (58.2 kg) (01/11 0605) Last BM Date: 09/24/17  Weight change: Filed Weights   09/24/17 1554 09/25/17 0514 09/26/17 0605  Weight: 130 lb 16 oz (59.4 kg) 131 lb 11.2 oz (59.7 kg) 128 lb 4.8 oz (58.2 kg)    Intake/Output:   Intake/Output Summary (Last 24 hours) at 09/26/2017 0822 Last data filed at 09/26/2017 0745 Gross per 24 hour  Intake 1560 ml  Output 3150 ml  Net -1590 ml      Physical Exam   General:In bed. Chronically-ill appearing. No resp difficulty. HEENT: Normal Neck: Supple. JVP flat. Carotids 2+ bilat; no bruits. No thyromegaly or nodule noted. Cor: PMI laterally displaced. RRR,  +S3 Lungs: clear, diminished throughout. Abdomen: Soft, non-tender, non-distended, no HSM. No bruits or masses. +BS  Extremities: No cyanosis, clubbing, or rash. R and LLE no edema. DL PICC in RUE Neuro: Drowsy, orientedx3, cranial nerves grossly intact. moves all 4 extremities w/o difficulty. Affect  flat  Telemetry   SR 80-90's. Personally reviewed.   EKG   No new tracings.   Labs    CBC Recent Labs    09/25/17 0407  WBC 7.9  NEUTROABS 4.7  HGB 12.6*  HCT 40.4  MCV 94.0  PLT 295   Basic Metabolic Panel Recent Labs    09/25/17 0407 09/26/17 0411  NA 138 136  K 3.9 3.8  CL 105 101  CO2 28 28  GLUCOSE 123* 95  BUN 21* 24*  CREATININE 1.06 0.97  CALCIUM 8.4* 8.9   Liver Function Tests No results for input(s): AST, ALT, ALKPHOS, BILITOT, PROT, ALBUMIN in the last 72 hours. No results for input(s): LIPASE, AMYLASE in the last 72 hours. Cardiac Enzymes No results for input(s): CKTOTAL, CKMB, CKMBINDEX, TROPONINI in the last 72 hours.  BNP: BNP (last 3 results) Recent Labs    09/23/17 1025  BNP 2,427.1*    ProBNP (last 3 results) No results for input(s): PROBNP in the last 8760 hours.   D-Dimer Recent Labs    09/23/17 1022  DDIMER 1.78*   Hemoglobin A1C Recent Labs    09/23/17 1517  HGBA1C 5.5   Fasting Lipid Panel No results for input(s): CHOL, HDL, LDLCALC, TRIG, CHOLHDL, LDLDIRECT in the last 72 hours. Thyroid Function Tests Recent Labs    09/23/17 1511  TSH 3.880    Other results:  Imaging    No results found.   Medications:     Scheduled Medications: . aspirin EC  81 mg Oral Daily  . atorvastatin  40 mg Oral Daily  . digoxin  0.125 mg Oral Daily  . enoxaparin (LOVENOX) injection  40 mg Subcutaneous Q24H  . furosemide  40 mg Oral Daily  . levalbuterol  0.63 mg Nebulization BID  . levothyroxine  25 mcg Oral QAC breakfast  . losartan  12.5 mg Oral Daily    Infusions: . ferumoxytol Stopped (09/24/17 1625)    PRN Medications: acetaminophen **OR** acetaminophen, bisacodyl, famotidine, HYDROcodone-acetaminophen, ondansetron **OR** ondansetron (ZOFRAN) IV, senna-docusate, sodium chloride flush, traZODone    Patient Profile  [name] is a 52 year old with a history of hepatitis C, barrett esophagus,  NICM, COPD,  hypothyroidism, smoker 1-2 cigarettes per day, chronic systolic heart failure, and noncompliance.  Admitted with volume overload in the setting of medication noncompliance.    Assessment/Plan  1. A/C Systolic Heart Failure due to severe NICM  - previous EF 20-25%. Repeat ECHO1/9/19 EF 25% RV mild to moderately down.  NICM. He has had cath in the past at [facility].  - Volume status improved. - + orthostatics and symptomatic dizziness. - Stop lasix. Give 250 NS bolus and reassess diuretics tomorrow. (already got lasix today) - Creatinine stable. - Hold off on beta blocker for now with acute decompensation.  - Continue dig 0.125 mg daily.  - No spiro with noncompliance. - Continue 12.5 mg losartan. SBP soft no room to titrate.  - [name] low at 55% 1/9. Recheck ordered for today.  2. COPD - Per primary. No wheeze.  3. Smoker - Discussed smoking cessation. No change.   4. Noncompliance - Not candidate for advanced therapies. Discussed this in depth yesterday, along with consideration of hospice soon.  5. IDA - Given feraheme yesterday. Will monitor.  HF follow up has been set up.  Medication concerns reviewed with patient and pharmacy team. Barriers identified: Poor insight.   Length of Stay: [place], NP  09/26/2017, 8:22 AM  Advanced Heart Failure Team Pager [phone] (M-F; 7a - 4p)  Please contact [facility] for night-coverage after hours (4p -7a ) and weekends on [email or web address]  Patient seen and examined with the above-signed Advanced Practice Provider and/or Housestaff. I personally reviewed laboratory data, imaging studies and relevant notes. I independently examined the patient and formulated the important aspects of the plan. I have edited the note to reflect any of my changes or salient points. I have personally discussed the plan with the patient and/or family.  He remains very weak. Suspect partly due to low output HF and also deconditioning. Unfortunately not  candidate for home inotropes or other advanced therapies so will not pursue RHC at this time. He was overdiuresed and hopefully will improve with giving some fluid back. Agree with need for SNF and he is ok with this.   On d/c would likely use Losartan 12.5 daily Digoxin 0.125 Lasix 40 daily ECASA 81 Atorva 40 daily  Would hold carvedilol with low output.   Glori Bickers, MD  3:30 PM

## 2017-09-26 NOTE — NC FL2 (Signed)
[place] LEVEL OF CARE SCREENING TOOL     IDENTIFICATION  Patient Name: Christopher Meyers Birthdate: 1965/11/20 Sex: male Admission Date (Current Location): 09/23/2017  [facility] and [place] Number:  [employer] and Address:  [facility], [place] [address] [place], [place] [address]      Provider Number: [id number]  Attending Physician Name and Address:  Alma Friendly, MD  Relative Name and Phone Number:       Current Level of Care: Hospital Recommended Level of Care: [facility] Prior Approval Number:    Date Approved/Denied:   PASRR Number: [id number]  Discharge Plan: SNF    Current Diagnoses: Patient Active Problem List   Diagnosis Date Noted  . Acute exacerbation of CHF (congestive heart failure) ([place]) 09/23/2017  . Congestive heart failure (CHF) ([place]) 09/23/2017  . History of cardiac cath 08/06/2017  . Chronic systolic congestive heart failure ([place]) 07/10/2017  . Panlobular emphysema ([place]) 07/10/2017  . CAD (coronary artery disease) 06/06/2017  . Barrett's esophagus without dysplasia 05/30/2015  . Chronic fatigue 02/10/2015  . Sinus tachycardia 10/01/2012  . Polycythemia 10/01/2012  . COPD (chronic obstructive pulmonary disease) ([place]) 10/01/2012  . Chronic obstructive pulmonary disease ([place]) 10/01/2012  . SOB (shortness of breath) 09/30/2012  . Fever 09/30/2012  . Dehydration 09/30/2012  . Cardiomyopathy ([place]) 09/30/2012  . History of hepatitis C 09/30/2012  . Influenza A H1N1 infection 09/30/2012    Orientation RESPIRATION BLADDER Height & Weight     Self, Situation, Time, Place  Normal Continent Weight: 128 lb 4.8 oz (58.2 kg) Height:  5\' 9"  (175.3 cm)  BEHAVIORAL SYMPTOMS/MOOD NEUROLOGICAL BOWEL NUTRITION STATUS  (Calm, cooperative, flat affect) (None) Continent Diet(Heart healthy)  AMBULATORY STATUS COMMUNICATION OF NEEDS Skin   Limited Assist Verbally Normal                        Personal Care Assistance Level of Assistance  Bathing, Feeding, Dressing Bathing Assistance: Limited assistance Feeding assistance: Independent Dressing Assistance: Limited assistance     Functional Limitations Info  Sight, Hearing, Speech Sight Info: Adequate Hearing Info: Adequate Speech Info: Adequate    SPECIAL CARE FACTORS FREQUENCY  PT (By licensed PT), OT (By licensed OT)     PT Frequency: 5 x week OT Frequency: 5 x week            Contractures Contractures Info: Not present    Additional Factors Info  Code Status, Allergies Code Status Info: Full Allergies Info: NKDA           Current Medications (09/26/2017):  This is the current hospital active medication list Current Facility-Administered Medications  Medication Dose Route Frequency Provider Last Rate Last Dose  . acetaminophen (TYLENOL) tablet 650 mg  650 mg Oral Q6H PRN Rondel Jumbo, PA-C       Or  . acetaminophen (TYLENOL) suppository 650 mg  650 mg Rectal Q6H PRN Rondel Jumbo, PA-C      . aspirin EC tablet 81 mg  81 mg Oral Daily Rondel Jumbo, PA-C   81 mg at 09/26/17 0900  . atorvastatin (LIPITOR) tablet 40 mg  40 mg Oral Daily Rondel Jumbo, PA-C   40 mg at 09/26/17 0900  . bisacodyl (DULCOLAX) suppository 10 mg  10 mg Rectal Daily PRN Rondel Jumbo, PA-C      . digoxin (LANOXIN) tablet 0.125 mg  0.125 mg Oral Daily Clegg, Amy D,  NP   0.125 mg at 09/26/17 0900  . enoxaparin (LOVENOX) injection 40 mg  40 mg Subcutaneous Q24H Rondel Jumbo, PA-C   40 mg at 09/25/17 1739  . famotidine (PEPCID) tablet 20 mg  20 mg Oral PRN Rondel Jumbo, PA-C      . ferumoxytol [facility]) 510 mg in sodium chloride 0.9 % 100 mL IVPB  510 mg Intravenous Weekly Clegg, Amy D, NP   Stopped at 09/24/17 1625  . HYDROcodone-acetaminophen (NORCO/VICODIN) 5-325 MG per tablet 1-2 tablet  1-2 tablet Oral Q4H PRN Rondel Jumbo, PA-C   2 tablet at 09/25/17 2142  . levalbuterol (XOPENEX) nebulizer solution 0.63 mg   0.63 mg Nebulization BID Alma Friendly, MD      . levothyroxine (SYNTHROID, LEVOTHROID) tablet 25 mcg  25 mcg Oral QAC breakfast Rondel Jumbo, PA-C   25 mcg at 09/26/17 0900  . losartan (COZAAR) tablet 12.5 mg  12.5 mg Oral Daily Clegg, Amy D, NP   12.5 mg at 09/26/17 0900  . ondansetron (ZOFRAN) tablet 4 mg  4 mg Oral Q6H PRN Rondel Jumbo, PA-C       Or  . ondansetron [facility]) injection 4 mg  4 mg Intravenous Q6H PRN Rondel Jumbo, PA-C      . senna-docusate (Senokot-S) tablet 1 tablet  1 tablet Oral QHS PRN Rondel Jumbo, PA-C      . sodium chloride flush (NS) 0.9 % injection 10-40 mL  10-40 mL Intracatheter PRN Karmen Bongo, MD   20 mL at 09/24/17 0923  . traZODone (DESYREL) tablet 25 mg  25 mg Oral QHS PRN Rondel Jumbo, PA-C   25 mg at 09/25/17 2142     Discharge Medications: Please see discharge summary for a list of discharge medications.  Relevant Imaging Results:  Relevant Lab Results:   Additional Information SS#: [id number]. Understands his check will go to the facility and he will have to stay a minimum of 30 days.  Candie Chroman, LCSW

## 2017-09-27 DIAGNOSIS — I5043 Acute on chronic combined systolic (congestive) and diastolic (congestive) heart failure: Secondary | ICD-10-CM

## 2017-09-27 LAB — BASIC METABOLIC PANEL
Anion gap: 6 (ref 5–15)
BUN: 26 mg/dL — ABNORMAL HIGH (ref 6–20)
CHLORIDE: 99 mmol/L — AB (ref 101–111)
CO2: 26 mmol/L (ref 22–32)
CREATININE: 0.98 mg/dL (ref 0.61–1.24)
Calcium: 8.5 mg/dL — ABNORMAL LOW (ref 8.9–10.3)
GFR calc Af Amer: >60 mL/min (ref >60)
GFR calc non Af Amer: >60 mL/min (ref >60)
Glucose, Bld: 99 mg/dL (ref 65–99)
Potassium: 4 mmol/L (ref 3.5–5.1)
SODIUM: 131 mmol/L — AB (ref 135–145)

## 2017-09-27 MED ORDER — DIGOXIN 125 MCG PO TABS: 0.1250 mg | ORAL_TABLET | Freq: Every day | ORAL | 0 refills | Status: DC

## 2017-09-27 MED ORDER — DIGOXIN 125 MCG PO TABS: 0.1250 mg | ORAL_TABLET | Freq: Every day | ORAL | Status: DC

## 2017-09-27 MED ORDER — LOSARTAN POTASSIUM 25 MG PO TABS: 12.5000 mg | ORAL_TABLET | Freq: Every day | ORAL | 0 refills | Status: DC

## 2017-09-27 MED ORDER — LOSARTAN POTASSIUM 25 MG PO TABS: 12.5000 mg | ORAL_TABLET | Freq: Every day | ORAL | Status: DC

## 2017-09-27 NOTE — Clinical Social Work Note (Signed)
Patient has two bed offers: [facility] and [facility]. He has chosen [facility]. Attempting to reach admissions to confirm bed availability today.  Dayton Scrape, [place]

## 2017-09-27 NOTE — Care Management (Signed)
Pt had planned to have someone pick him up but now states he wants to go home on bus.  Staff does not feel that is a safe plan.  SW consulted and asked that CM arrange ambulance. [place] form completed again and [facility] called.  There will be a wait of several hours for ambulance.

## 2017-09-27 NOTE — Progress Notes (Addendum)
Patient Details  Name: Christopher Meyers MRN: [id number] Date of Birth: 05-Jan-1966  Subjective/Objective:     COPD              Action/Plan: 09/26/2017- Patient lives at home; has Medicaid for medical insurance with prescription drug coverage; PCP assigned to is [place] but patient stated that the office is too far away for him to visit and stated that he will contact Medicaid to have it changed. He stated that he was recently at [facility] and they wanted to send him to a nursing facility for rehab but refused, now he is requesting to go now; Physical Therapy eval placed; CM will continue to follow for progression of care.  09/27/2017 - Patient changed his mind and now wants to go home with [facility] services provided by [place]; [facility] with [facility] called for arrangements.  Expected Discharge Date:  09/26/17                     Expected Discharge Plan:  [facility] possibly, pt refused and wants home with [facility]  Discharge planning Services  CM Consult  Status of Service: completed  [name] [phone] 09/27/2017, 12:15 PM

## 2017-09-27 NOTE — Clinical Social Work Placement (Signed)
   CLINICAL SOCIAL WORK PLACEMENT  NOTE  Date:  09/27/2017  Patient Details  Name: Christopher Meyers MRN: [id number] Date of Birth: 12/24/1965  Clinical Social Work is seeking post-discharge placement for this patient at the [place] level of care (*CSW will initial, date and re-position this form in  chart as items are completed):  Yes   Patient/family provided with [facility] Work Department's list of facilities offering this level of care within the geographic area requested by the patient (or if unable, by the patient's family).  Yes   Patient/family informed of their freedom to choose among providers that offer the needed level of care, that participate in Medicare, Medicaid or managed care program needed by the patient, have an available bed and are willing to accept the patient.  Yes   Patient/family informed of [facility]'s ownership interest in [facility] and [facility], as well as of the fact that they are under no obligation to receive care at these facilities.  PASRR submitted to EDS on 09/26/17     PASRR number received on 09/26/17     Existing PASRR number confirmed on       FL2 transmitted to all facilities in geographic area requested by pt/family on 09/26/17     FL2 transmitted to all facilities within larger geographic area on       Patient informed that his/her managed care company has contracts with or will negotiate with certain facilities, including the following:        Yes   Patient/family informed of bed offers received.  Patient chooses bed at [facility] and [place] recommends and patient chooses bed at      Patient to be transferred to [facility] on 09/27/17.  Patient to be transferred to facility by [facility]     Patient family notified on 09/27/17 of transfer.  Name of family member notified:  None     PHYSICIAN Please prepare prescriptions     Additional Comment:     _______________________________________________ Candie Chroman, LCSW 09/27/2017, 2:41 PM

## 2017-09-27 NOTE — Progress Notes (Signed)
Pt.discharged home discharge instructions given. Questions answered. [facility] arrived to pick up pt. Taken down to truck pt. stated he didn't live at the address on file and [facility] notified. AC to see pt on truck.

## 2017-09-27 NOTE — Progress Notes (Addendum)
Patient ID: Christopher Meyers, male   DOB: 07-18-1966, 52 y.o.   MRN: [id number]     Advanced Heart Failure Rounding Note  PCP:  Primary Cardiologist: Dr Wyline Copas  Subjective:    He feels about the same today.  Not short of breath at rest or in room but very fatigued/tired.  SBP 90s generally. Dizzy with standing.   Has been seen by PT. Recommending SNF/ He is ok with this. Has beds available today.   UDS + for amphetamines but adamantly denies meth use.   Echo 09/24/17 EF 25% RV mild to moderately down.   Objective:   Weight Range: 129 lb 9.6 oz (58.8 kg) Body mass index is 19.14 kg/m.   Vital Signs:   Temp:  [97.4 F (36.3 C)-97.9 F (36.6 C)] 97.4 F (36.3 C) (01/12 0624) Pulse Rate:  [87-99] 87 (01/12 0624) Resp:  [18] 18 (01/11 2036) BP: (87-101)/(55-70) 87/55 (01/12 0824) SpO2:  [99 %-100 %] 99 % (01/12 1019) Weight:  [129 lb 9.6 oz (58.8 kg)] 129 lb 9.6 oz (58.8 kg) (01/12 0624) Last BM Date: 09/26/17  Weight change: Filed Weights   09/25/17 0514 09/26/17 0605 09/27/17 0624  Weight: 131 lb 11.2 oz (59.7 kg) 128 lb 4.8 oz (58.2 kg) 129 lb 9.6 oz (58.8 kg)    Intake/Output:   Intake/Output Summary (Last 24 hours) at 09/27/2017 1159 Last data filed at 09/27/2017 1000 Gross per 24 hour  Intake 659.58 ml  Output 1625 ml  Net -965.42 ml      Physical Exam   General: NAD, fatigued Neck: No JVD, no thyromegaly or thyroid nodule.  Lungs: Clear to auscultation bilaterally with normal respiratory effort. CV: Nondisplaced PMI.  Heart regular S1/S2, no S3/S4, no murmur.  No peripheral edema.  No carotid bruit.  Normal pedal pulses.  Abdomen: Soft, nontender, no hepatosplenomegaly, no distention.  Skin: Intact without lesions or rashes.  Neurologic: Alert and oriented x 3.  Psych: Normal affect. Extremities: No clubbing or cyanosis.  HEENT: Normal.    Telemetry   SR 80-90's. Personally reviewed.   EKG   No new tracings.   Labs    CBC Recent Labs     09/25/17 0407  WBC 7.9  NEUTROABS 4.7  HGB 12.6*  HCT 40.4  MCV 94.0  PLT 893   Basic Metabolic Panel Recent Labs    09/26/17 0411 09/27/17 0309  NA 136 131*  K 3.8 4.0  CL 101 99*  CO2 28 26  GLUCOSE 95 99  BUN 24* 26*  CREATININE 0.97 0.98  CALCIUM 8.9 8.5*   Liver Function Tests No results for input(s): AST, ALT, ALKPHOS, BILITOT, PROT, ALBUMIN in the last 72 hours. No results for input(s): LIPASE, AMYLASE in the last 72 hours. Cardiac Enzymes No results for input(s): CKTOTAL, CKMB, CKMBINDEX, TROPONINI in the last 72 hours.  BNP: BNP (last 3 results) Recent Labs    09/23/17 1025  BNP 2,427.1*    ProBNP (last 3 results) No results for input(s): PROBNP in the last 8760 hours.   D-Dimer No results for input(s): DDIMER in the last 72 hours. Hemoglobin A1C No results for input(s): HGBA1C in the last 72 hours. Fasting Lipid Panel No results for input(s): CHOL, HDL, LDLCALC, TRIG, CHOLHDL, LDLDIRECT in the last 72 hours. Thyroid Function Tests No results for input(s): TSH, T4TOTAL, T3FREE, THYROIDAB in the last 72 hours.  Invalid input(s): FREET3  Other results:   Imaging    No results found.   Medications:  Scheduled Medications: . aspirin EC  81 mg Oral Daily  . atorvastatin  40 mg Oral Daily  . digoxin  0.125 mg Oral Daily  . enoxaparin (LOVENOX) injection  40 mg Subcutaneous Q24H  . levalbuterol  0.63 mg Nebulization BID  . levothyroxine  25 mcg Oral QAC breakfast  . losartan  12.5 mg Oral Daily    Infusions: . ferumoxytol Stopped (09/24/17 1625)    PRN Medications: acetaminophen **OR** acetaminophen, bisacodyl, famotidine, HYDROcodone-acetaminophen, ondansetron **OR** ondansetron (ZOFRAN) IV, senna-docusate, sodium chloride flush, traZODone    Patient Profile  Christopher Meyers is a 52 year old with a history of hepatitis C, barrett esophagus,  NICM, COPD, hypothyroidism, smoker 1-2 cigarettes per day, chronic systolic heart failure,  and noncompliance.  Admitted with volume overload in the setting of medication noncompliance.    Assessment/Plan   1. A/C Systolic Heart Failure due to severe NICM: Repeat ECHO1/9/19 EF 25% RV mild to moderately down, similar to prior. NICM. He has had cath in the past at [facility]. Volume status improved and likely actually over-diuresed. Has had orthostasis and tolerating minimal meds. Still with marked fatigue.  - Will need rehab/SNF stay.  - Can start Lasix 40 mg daily tomorrow.  - Hold off on beta blocker for now with acute decompensation.  - Continue dig 0.125 mg daily.  - Can try to continue losartan 12.5 mg qhs. .  2. COPD: Per primary. No wheeze.  3. Smoker: Discussed smoking cessation. No change.   4. Noncompliance: Not candidate for advanced therapies or home milrinone. May need hospice son.  5. IDA: Given feraheme this admission.  HF follow up has been set up. Rehab bed available.   On d/c would use Losartan 12.5 qhs Digoxin 0.125 Lasix 40 daily ECASA 81 Atorva 40 daily  Would hold carvedilol with low output.   Loralie Champagne, MD  11:59 AM  09/27/2017
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
## 2017-09-27 NOTE — Clinical Social Work Note (Addendum)
CSW facilitated patient discharge including contacting patient family and facility to confirm patient discharge plans. Clinical information faxed to facility and family agreeable with plan. RN will arrange ambulance transport via [place] to [facility] once IV team has seen him. RN to call report prior to discharge [phone]).  To call [facility]:  [phone]: Option 1, Option 3. Not an emergency call. Patient does not have any special equipment needs.  CSW will sign off for now as social work intervention is no longer needed. Please consult [place] again if new needs arise.  Dayton Scrape, [place] [phone]  3:39 pm Patient has decided he cannot afford to lose his Medicaid check. He states he is two months behind on rent and if he misses another he and his family will be evicted. He has called someone to come pick him up and take him home and CSW confirmed with them as well. RN, MD, and RNCM aware.  CSW signing off.  Dayton Scrape, [place]

## 2017-10-04 DIAGNOSIS — Z720 Tobacco use: Secondary | ICD-10-CM

## 2017-10-04 DIAGNOSIS — E039 Hypothyroidism, unspecified: Secondary | ICD-10-CM | POA: Diagnosis not present

## 2017-10-04 DIAGNOSIS — F1510 Other stimulant abuse, uncomplicated: Secondary | ICD-10-CM | POA: Diagnosis not present

## 2017-10-04 DIAGNOSIS — R0789 Other chest pain: Secondary | ICD-10-CM | POA: Diagnosis not present

## 2017-10-05 DIAGNOSIS — Z720 Tobacco use: Secondary | ICD-10-CM | POA: Diagnosis not present

## 2017-10-05 DIAGNOSIS — R079 Chest pain, unspecified: Secondary | ICD-10-CM

## 2017-10-05 DIAGNOSIS — R0789 Other chest pain: Secondary | ICD-10-CM | POA: Diagnosis not present

## 2017-10-05 DIAGNOSIS — F1510 Other stimulant abuse, uncomplicated: Secondary | ICD-10-CM | POA: Diagnosis not present

## 2017-10-05 DIAGNOSIS — E039 Hypothyroidism, unspecified: Secondary | ICD-10-CM | POA: Diagnosis not present

## 2017-10-07 ENCOUNTER — Encounter (HOSPITAL_COMMUNITY): Payer: Medicaid Other

## 2017-11-04 DIAGNOSIS — Z9119 Patient's noncompliance with other medical treatment and regimen: Secondary | ICD-10-CM

## 2017-11-04 DIAGNOSIS — I509 Heart failure, unspecified: Secondary | ICD-10-CM

## 2017-11-04 DIAGNOSIS — R079 Chest pain, unspecified: Secondary | ICD-10-CM

## 2017-11-04 DIAGNOSIS — Z720 Tobacco use: Secondary | ICD-10-CM | POA: Diagnosis not present

## 2017-11-04 DIAGNOSIS — R0789 Other chest pain: Secondary | ICD-10-CM

## 2017-11-04 DIAGNOSIS — I214 Non-ST elevation (NSTEMI) myocardial infarction: Secondary | ICD-10-CM

## 2017-11-05 DIAGNOSIS — R0789 Other chest pain: Secondary | ICD-10-CM | POA: Diagnosis not present

## 2017-11-05 DIAGNOSIS — R079 Chest pain, unspecified: Secondary | ICD-10-CM | POA: Diagnosis not present

## 2017-11-05 DIAGNOSIS — Z9119 Patient's noncompliance with other medical treatment and regimen: Secondary | ICD-10-CM | POA: Diagnosis not present

## 2017-11-05 DIAGNOSIS — I214 Non-ST elevation (NSTEMI) myocardial infarction: Secondary | ICD-10-CM | POA: Diagnosis not present

## 2017-11-05 DIAGNOSIS — I509 Heart failure, unspecified: Secondary | ICD-10-CM | POA: Diagnosis not present

## 2017-11-06 DIAGNOSIS — I509 Heart failure, unspecified: Secondary | ICD-10-CM | POA: Diagnosis not present

## 2017-11-06 DIAGNOSIS — R079 Chest pain, unspecified: Secondary | ICD-10-CM | POA: Diagnosis not present

## 2017-11-06 DIAGNOSIS — R0789 Other chest pain: Secondary | ICD-10-CM | POA: Diagnosis not present

## 2017-11-06 DIAGNOSIS — Z9119 Patient's noncompliance with other medical treatment and regimen: Secondary | ICD-10-CM | POA: Diagnosis not present

## 2017-11-06 DIAGNOSIS — I214 Non-ST elevation (NSTEMI) myocardial infarction: Secondary | ICD-10-CM | POA: Diagnosis not present

## 2017-11-07 DIAGNOSIS — Z9119 Patient's noncompliance with other medical treatment and regimen: Secondary | ICD-10-CM | POA: Diagnosis not present

## 2017-11-07 DIAGNOSIS — I214 Non-ST elevation (NSTEMI) myocardial infarction: Secondary | ICD-10-CM | POA: Diagnosis not present

## 2017-11-07 DIAGNOSIS — I509 Heart failure, unspecified: Secondary | ICD-10-CM | POA: Diagnosis not present

## 2017-11-07 DIAGNOSIS — R0789 Other chest pain: Secondary | ICD-10-CM | POA: Diagnosis not present

## 2018-03-25 ENCOUNTER — Other Ambulatory Visit: Payer: Self-pay

## 2018-03-25 ENCOUNTER — Emergency Department (HOSPITAL_COMMUNITY)
Admission: EM | Admit: 2018-03-25 | Discharge: 2018-03-25 | Disposition: A | Discharge status: Home / Self Care | Payer: Medicaid Other | Attending: Emergency Medicine | Admitting: Emergency Medicine

## 2018-03-25 ENCOUNTER — Emergency Department (HOSPITAL_COMMUNITY): Payer: Medicaid Other

## 2018-03-25 ENCOUNTER — Encounter (HOSPITAL_COMMUNITY): Payer: Self-pay | Admitting: *Deleted

## 2018-03-25 DIAGNOSIS — Z5321 Procedure and treatment not carried out due to patient leaving prior to being seen by health care provider: Secondary | ICD-10-CM | POA: Diagnosis not present

## 2018-03-25 DIAGNOSIS — R079 Chest pain, unspecified: Secondary | ICD-10-CM | POA: Insufficient documentation

## 2018-03-25 LAB — CBC
HCT: 42 % (ref 39.0–52.0)
Hemoglobin: 13.4 g/dL (ref 13.0–17.0)
MCH: 31 pg (ref 26.0–34.0)
MCHC: 31.9 g/dL (ref 30.0–36.0)
MCV: 97.2 fL (ref 78.0–100.0)
PLATELETS: 236 10*3/uL (ref 150–400)
RBC: 4.32 MIL/uL (ref 4.22–5.81)
RDW: 12.7 % (ref 11.5–15.5)
WBC: 5.3 10*3/uL (ref 4.0–10.5)

## 2018-03-25 LAB — BASIC METABOLIC PANEL
Anion gap: 8 (ref 5–15)
BUN: 19 mg/dL (ref 6–20)
CO2: 22 mmol/L (ref 22–32)
CREATININE: 0.95 mg/dL (ref 0.61–1.24)
Calcium: 8.9 mg/dL (ref 8.9–10.3)
Chloride: 108 mmol/L (ref 98–111)
GFR calc Af Amer: >60 mL/min (ref >60)
GLUCOSE: 114 mg/dL — AB (ref 70–99)
Potassium: 4.1 mmol/L (ref 3.5–5.1)
SODIUM: 138 mmol/L (ref 135–145)

## 2018-03-25 LAB — TROPONIN I: Troponin I: <0.03 ng/mL (ref <0.03)

## 2018-03-25 NOTE — ED Notes (Signed)
Results reviewed, no changes in acuity at this time 

## 2018-03-25 NOTE — ED Notes (Signed)
Pt called for vitals with no response.

## 2018-03-25 NOTE — ED Notes (Signed)
Pt called in lobby x 3 for vs recheck and for room and no response. Left post triage

## 2018-03-25 NOTE — ED Triage Notes (Signed)
The pt is c/o chest pain with numbness in his entire lt side for 2 days  He has also had a headache   He c/os burning on his lt arm and lt leg

## 2018-03-25 NOTE — ED Notes (Signed)
Second call in lobby for pt. No response. Rn notified

## 2018-04-12 ENCOUNTER — Other Ambulatory Visit: Payer: Self-pay

## 2018-04-12 ENCOUNTER — Encounter (HOSPITAL_COMMUNITY): Payer: Self-pay | Admitting: Emergency Medicine

## 2018-04-12 ENCOUNTER — Emergency Department (HOSPITAL_COMMUNITY): Payer: Medicaid Other

## 2018-04-12 ENCOUNTER — Emergency Department (HOSPITAL_COMMUNITY)
Admission: EM | Admit: 2018-04-12 | Discharge: 2018-04-12 | Disposition: A | Discharge status: Home / Self Care | Payer: Medicaid Other | Attending: Emergency Medicine | Admitting: Emergency Medicine

## 2018-04-12 DIAGNOSIS — I2510 Atherosclerotic heart disease of native coronary artery without angina pectoris: Secondary | ICD-10-CM | POA: Diagnosis not present

## 2018-04-12 DIAGNOSIS — J449 Chronic obstructive pulmonary disease, unspecified: Secondary | ICD-10-CM | POA: Insufficient documentation

## 2018-04-12 DIAGNOSIS — Z7982 Long term (current) use of aspirin: Secondary | ICD-10-CM | POA: Diagnosis not present

## 2018-04-12 DIAGNOSIS — Z79899 Other long term (current) drug therapy: Secondary | ICD-10-CM | POA: Insufficient documentation

## 2018-04-12 DIAGNOSIS — I110 Hypertensive heart disease with heart failure: Secondary | ICD-10-CM | POA: Insufficient documentation

## 2018-04-12 DIAGNOSIS — R0602 Shortness of breath: Secondary | ICD-10-CM | POA: Diagnosis present

## 2018-04-12 DIAGNOSIS — I5022 Chronic systolic (congestive) heart failure: Secondary | ICD-10-CM | POA: Diagnosis not present

## 2018-04-12 DIAGNOSIS — F17210 Nicotine dependence, cigarettes, uncomplicated: Secondary | ICD-10-CM | POA: Diagnosis not present

## 2018-04-12 DIAGNOSIS — I428 Other cardiomyopathies: Secondary | ICD-10-CM | POA: Insufficient documentation

## 2018-04-12 HISTORY — DX: Acute myocardial infarction, unspecified: I21.9

## 2018-04-12 LAB — BASIC METABOLIC PANEL
Anion gap: 10 (ref 5–15)
BUN: 24 mg/dL — AB (ref 6–20)
CHLORIDE: 108 mmol/L (ref 98–111)
CO2: 30 mmol/L (ref 22–32)
Calcium: 9.5 mg/dL (ref 8.9–10.3)
Creatinine, Ser: 1.01 mg/dL (ref 0.61–1.24)
GFR calc Af Amer: >60 mL/min (ref >60)
GFR calc non Af Amer: >60 mL/min (ref >60)
GLUCOSE: 147 mg/dL — AB (ref 70–99)
POTASSIUM: 4 mmol/L (ref 3.5–5.1)
SODIUM: 148 mmol/L — AB (ref 135–145)

## 2018-04-12 LAB — CBC
HEMATOCRIT: 41.4 % (ref 39.0–52.0)
Hemoglobin: 14 g/dL (ref 13.0–17.0)
MCH: 32 pg (ref 26.0–34.0)
MCHC: 33.8 g/dL (ref 30.0–36.0)
MCV: 94.7 fL (ref 78.0–100.0)
Platelets: 241 10*3/uL (ref 150–400)
RBC: 4.37 MIL/uL (ref 4.22–5.81)
RDW: 13 % (ref 11.5–15.5)
WBC: 5.8 10*3/uL (ref 4.0–10.5)

## 2018-04-12 LAB — I-STAT TROPONIN, ED: Troponin i, poc: 0.01 ng/mL (ref 0.00–0.08)

## 2018-04-12 MED ORDER — ALBUTEROL SULFATE (2.5 MG/3ML) 0.083% IN NEBU
5.0000 mg | INHALATION_SOLUTION | Freq: Once | RESPIRATORY_TRACT | Status: AC
  Administered 2018-04-12: 5 mg via RESPIRATORY_TRACT
  Filled 2018-04-12: qty 6

## 2018-04-12 MED ORDER — IPRATROPIUM BROMIDE 0.02 % IN SOLN
0.5000 mg | Freq: Once | RESPIRATORY_TRACT | Status: AC
  Administered 2018-04-12: 0.5 mg via RESPIRATORY_TRACT
  Filled 2018-04-12: qty 2.5

## 2018-04-12 NOTE — Discharge Instructions (Addendum)
It was our pleasure to provide your ER care today - we hope that you feel better.  Use albuterol treatment every 4 hours as need.   Drink adequate fluids. From today's lab tests your sodium level is mildly high (148) - drink adequate fluids, and follow up with primary care doctor in the coming week.  Also follow up closely with your cardiologist in the coming week.   Return to ER if worse, new symptoms, high fevers, increased trouble breathing, weak/fainting, other concern.

## 2018-04-12 NOTE — ED Provider Notes (Signed)
[facility] DEPT Provider Note   CSN: [id number] Arrival date & time: 04/12/18  2124     History   Chief Complaint Chief Complaint  Patient presents with  . Chest Pain  . Shortness of Breath    HPI Christopher Meyers is a 52 y.o. male.  Pt with hx non ischemic cardiomyopathy, copd, hospice care, presents c/o feeling sob for past 2-3 days. Symptoms gradual onset, persistent, mild. Felt tight in chest during that time, constant, mild. No episodic or exertional chest pain. No pleuritic chest pain. No leg pain or swelling. No hx dvt or pe. Former smoker. Uses mdi/neb prn, w some relief. Denies increase in cough, no sore throat or runny nose. No fever or chills. States compliant w home meds. Denies orthopnea/pnd.   The history is provided by the patient.  Chest Pain   Associated symptoms include shortness of breath. Pertinent negatives include no abdominal pain, no back pain, no fever, no headaches and no vomiting.  Shortness of Breath  Associated symptoms include wheezing and chest pain. Pertinent negatives include no fever, no headaches, no sore throat, no neck pain, no vomiting, no abdominal pain, no rash and no leg swelling.    Past Medical History:  Diagnosis Date  . Barrett's esophagus   . COPD (chronic obstructive pulmonary disease) ([place])   . Coronary artery disease   . Hepatitis   . Hypertension   . Myocardial infarction [facility])     Patient Active Problem List   Diagnosis Date Noted  . Acute exacerbation of CHF (congestive heart failure) ([place]) 09/23/2017  . Congestive heart failure (CHF) ([place]) 09/23/2017  . History of cardiac cath 08/06/2017  . Chronic systolic congestive heart failure ([place]) 07/10/2017  . Panlobular emphysema ([place]) 07/10/2017  . CAD (coronary artery disease) 06/06/2017  . Barrett's esophagus without dysplasia 05/30/2015  . Chronic fatigue 02/10/2015  . Sinus tachycardia 10/01/2012  . Polycythemia 10/01/2012  . COPD  (chronic obstructive pulmonary disease) ([place]) 10/01/2012  . Chronic obstructive pulmonary disease ([place]) 10/01/2012  . SOB (shortness of breath) 09/30/2012  . Fever 09/30/2012  . Dehydration 09/30/2012  . Cardiomyopathy ([place]) 09/30/2012  . History of hepatitis C 09/30/2012  . Influenza A H1N1 infection 09/30/2012    Past Surgical History:  Procedure Laterality Date  . APPENDECTOMY    . CARDIAC CATHETERIZATION    . CORONARY STENT PLACEMENT    . LIVER BIOPSY          Home Medications    Prior to Admission medications   Medication Sig Start Date End Date Taking? Authorizing Provider  albuterol (PROVENTIL HFA;VENTOLIN HFA) 108 (90 BASE) MCG/ACT inhaler Inhale 1-2 puffs into the lungs every 6 (six) hours as needed for wheezing or shortness of breath.    [provider]  antipyrine-benzocaine Toniann Fail) otic solution Place 2-4 drops into the right ear 4 (four) times daily as needed. Impacted cerumen    [provider]  aspirin EC 81 MG EC tablet Take 1 tablet (81 mg total) by mouth daily. 10/04/12   Eugenie Filler, MD  atorvastatin (LIPITOR) 40 MG tablet Take 40 mg by mouth daily. 06/06/17   [provider]  digoxin (LANOXIN) 0.125 MG tablet Take 1 tablet (0.125 mg total) by mouth daily. 09/28/17   Alma Friendly, MD  famotidine (PEPCID) 20 MG tablet Take 1 tablet (20 mg total) by mouth 2 (two) times daily. Patient taking differently: Take 20 mg by mouth as needed for heartburn or indigestion.  08/16/14   Britt Bottom, NP  furosemide (LASIX) 40 MG tablet Take 40 mg by mouth daily. 08/27/17 11/25/17  [provider]  gabapentin (NEURONTIN) 300 MG capsule Take 300 mg by mouth 3 (three) times daily as needed. 06/06/17   [provider]  levothyroxine (SYNTHROID, LEVOTHROID) 25 MCG tablet Take 25 mcg by mouth daily. 06/06/17   [provider]  losartan (COZAAR) 25 MG tablet Take 0.5 tablets (12.5 mg total) by mouth daily. 09/28/17    Alma Friendly, MD  nitroGLYCERIN (NITROSTAT) 0.4 MG SL tablet Place 0.4 mg under the tongue every 5 (five) minutes as needed. Chest pain    [provider]    Family History Family History  Problem Relation Age of Onset  . CAD Father   . Asthma Sister     Social History Social History   Tobacco Use  . Smoking status: Heavy Tobacco Smoker    Types: Cigarettes  . Smokeless tobacco: Never Used  Substance Use Topics  . Alcohol use: No  . Drug use: No     Allergies   Patient has no known allergies.   Review of Systems Review of Systems  Constitutional: Negative for fever.  HENT: Negative for sore throat.   Eyes: Negative for redness.  Respiratory: Positive for shortness of breath and wheezing.   Cardiovascular: Positive for chest pain. Negative for leg swelling.  Gastrointestinal: Negative for abdominal pain and vomiting.  Genitourinary: Negative for flank pain.  Musculoskeletal: Negative for back pain and neck pain.  Skin: Negative for rash.  Neurological: Negative for headaches.  Hematological: Does not bruise/bleed easily.  Psychiatric/Behavioral: Negative for confusion.     Physical Exam Updated Vital Signs BP (!) 120/92 (BP Location: Right Arm)   Pulse 60   Temp 97.6 F (36.4 C) (Oral)   Resp 18   Ht 1.753 m (5\' 9" )   Wt 61.2 kg (135 lb)   SpO2 99%   BMI 19.94 kg/m   Physical Exam  Constitutional: He is oriented to person, place, and time.  Alert, content appearing, no acute resp distress.   HENT:  Mouth/Throat: Oropharynx is clear and moist.  Eyes: Conjunctivae are normal.  Neck: Neck supple. No JVD present. No tracheal deviation present.  Cardiovascular: Normal rate, regular rhythm, normal heart sounds and intact distal pulses. Exam reveals no gallop and no friction rub.  No murmur heard. Pulmonary/Chest: Effort normal. No accessory muscle usage. No respiratory distress.  Mild expiratory wheezing.   Abdominal: Soft. Bowel sounds  are normal. He exhibits no distension. There is no tenderness.  Musculoskeletal: He exhibits no edema or tenderness.  Neurological: He is alert and oriented to person, place, and time.  Skin: Skin is warm and dry. No rash noted.  Psychiatric: He has a normal mood and affect.  Nursing note and vitals reviewed.    ED Treatments / Results  Labs (all labs ordered are listed, but only abnormal results are displayed) Results for orders placed or performed during the hospital encounter of 73/41/93  Basic metabolic panel  Result Value Ref Range   Sodium 148 (H) 135 - 145 mmol/L   Potassium 4.0 3.5 - 5.1 mmol/L   Chloride 108 98 - 111 mmol/L   CO2 30 22 - 32 mmol/L   Glucose, Bld 147 (H) 70 - 99 mg/dL   BUN 24 (H) 6 - 20 mg/dL   Creatinine, Ser 1.01 0.61 - 1.24 mg/dL   Calcium 9.5 8.9 - 10.3 mg/dL   GFR  calc non Af Amer >60 >60 mL/min   GFR calc Af Amer >60 >60 mL/min   Anion gap 10 5 - 15  CBC  Result Value Ref Range   WBC 5.8 4.0 - 10.5 K/uL   RBC 4.37 4.22 - 5.81 MIL/uL   Hemoglobin 14.0 13.0 - 17.0 g/dL   HCT 41.4 39.0 - 52.0 %   MCV 94.7 78.0 - 100.0 fL   MCH 32.0 26.0 - 34.0 pg   MCHC 33.8 30.0 - 36.0 g/dL   RDW 13.0 11.5 - 15.5 %   Platelets 241 150 - 400 K/uL  I-stat troponin, ED  Result Value Ref Range   Troponin i, poc 0.01 0.00 - 0.08 ng/mL   Comment 3           Dg Chest 2 View  Result Date: 04/12/2018 CLINICAL DATA:  Chest pain, shortness of breath EXAM: CHEST - 2 VIEW COMPARISON:  03/25/2018 FINDINGS: Heart and mediastinal contours are within normal limits. No focal opacities or effusions. No acute bony abnormality. Mild S-shaped thoracolumbar scoliosis. IMPRESSION: No active cardiopulmonary disease. Electronically Signed   By: Rolm Baptise M.D.   On: 04/12/2018 22:17   Dg Chest 2 View  Result Date: 03/25/2018 CLINICAL DATA:  Chest pain EXAM: CHEST - 2 VIEW COMPARISON:  02/09/2018 chest radiograph. FINDINGS: Stable cardiomediastinal silhouette with normal heart  size. No pneumothorax. No pleural effusion. Lungs appear clear, with no acute consolidative airspace disease and no pulmonary edema. IMPRESSION: No active cardiopulmonary disease. Electronically Signed   By: Ilona Sorrel M.D.   On: 03/25/2018 18:55    EKG EKG Interpretation  Date/Time:  Sunday April 12 2018 21:41:44 EDT Ventricular Rate:  69 PR Interval:    QRS Duration: 114 QT Interval:  405 QTC Calculation: 434 R Axis:   67 Text Interpretation:  Sinus rhythm LVH with IVCD and secondary repol abnrm No significant change since last tracing Confirmed by Lajean Saver [phone]) on 04/12/2018 10:20:49 PM   Radiology Dg Chest 2 View  Result Date: 04/12/2018 CLINICAL DATA:  Chest pain, shortness of breath EXAM: CHEST - 2 VIEW COMPARISON:  03/25/2018 FINDINGS: Heart and mediastinal contours are within normal limits. No focal opacities or effusions. No acute bony abnormality. Mild S-shaped thoracolumbar scoliosis. IMPRESSION: No active cardiopulmonary disease. Electronically Signed   By: Rolm Baptise M.D.   On: 04/12/2018 22:17    Procedures Procedures (including critical care time)  Medications Ordered in ED Medications  albuterol (PROVENTIL) (2.5 MG/3ML) 0.083% nebulizer solution 5 mg (has no administration in time range)  ipratropium (ATROVENT) nebulizer solution 0.5 mg (has no administration in time range)     Initial Impression / Assessment and Plan / ED Course  I have reviewed the triage vital signs and the nursing notes.  Pertinent labs & imaging results that were available during my care of the patient were reviewed by me and considered in my medical decision making (see chart for details).  Labs. Ecg. Cxr.   Reviewed nursing notes and prior charts for additional history. Hx non ischemic cm, copd. Pt notes he is with hospice care.   After symptoms for 2-3 days, constant, trop is normal - symptoms felt not c/w acs.   Pt w hx copd. His room air sats are 98-100%. Mild wheezing,  albuterol neb.   Labs reviewed - na mildly elev, pt notes recent relatively poor po intake.  xrays reviewed - no pna.   Pt is breathing comfortably, normal vitals, afebrile - pt currently appears  stable for d/c.  rec close outpt f/u w his pcp/cardiologist.   Return precautions provided.    Final Clinical Impressions(s) / ED Diagnoses   Final diagnoses:  None    ED Discharge Orders    None       Lajean Saver, MD 04/12/18 2238

## 2018-04-12 NOTE — ED Notes (Signed)
Bed: WA03 Expected date:  Expected time:  Means of arrival:  Comments: 

## 2018-04-12 NOTE — ED Triage Notes (Signed)
Pt reports chest pain and shortness of breath for the last 3 days. Pt reports left chest pain with pain into back of neck.

## 2018-08-26 ENCOUNTER — Encounter (HOSPITAL_COMMUNITY): Payer: Self-pay | Admitting: Emergency Medicine

## 2018-08-26 ENCOUNTER — Other Ambulatory Visit: Payer: Self-pay

## 2018-08-26 ENCOUNTER — Emergency Department (HOSPITAL_COMMUNITY): Payer: Medicaid Other

## 2018-08-26 ENCOUNTER — Inpatient Hospital Stay (HOSPITAL_COMMUNITY)
Admission: EM | Admit: 2018-08-26 | Discharge: 2018-09-05 | DRG: 481 | Disposition: A | Discharge status: Home / Self Care | Payer: Medicaid Other | Attending: Student | Admitting: Student

## 2018-08-26 DIAGNOSIS — Z9981 Dependence on supplemental oxygen: Secondary | ICD-10-CM

## 2018-08-26 DIAGNOSIS — K219 Gastro-esophageal reflux disease without esophagitis: Secondary | ICD-10-CM | POA: Diagnosis present

## 2018-08-26 DIAGNOSIS — S0240DA Maxillary fracture, left side, initial encounter for closed fracture: Secondary | ICD-10-CM | POA: Diagnosis present

## 2018-08-26 DIAGNOSIS — I110 Hypertensive heart disease with heart failure: Secondary | ICD-10-CM | POA: Diagnosis present

## 2018-08-26 DIAGNOSIS — I252 Old myocardial infarction: Secondary | ICD-10-CM | POA: Diagnosis not present

## 2018-08-26 DIAGNOSIS — I5084 End stage heart failure: Secondary | ICD-10-CM | POA: Diagnosis present

## 2018-08-26 DIAGNOSIS — F17210 Nicotine dependence, cigarettes, uncomplicated: Secondary | ICD-10-CM | POA: Diagnosis present

## 2018-08-26 DIAGNOSIS — F1110 Opioid abuse, uncomplicated: Secondary | ICD-10-CM | POA: Diagnosis present

## 2018-08-26 DIAGNOSIS — S022XXA Fracture of nasal bones, initial encounter for closed fracture: Secondary | ICD-10-CM | POA: Diagnosis present

## 2018-08-26 DIAGNOSIS — J449 Chronic obstructive pulmonary disease, unspecified: Secondary | ICD-10-CM | POA: Diagnosis present

## 2018-08-26 DIAGNOSIS — Q899 Congenital malformation, unspecified: Secondary | ICD-10-CM

## 2018-08-26 DIAGNOSIS — T1490XA Injury, unspecified, initial encounter: Secondary | ICD-10-CM

## 2018-08-26 DIAGNOSIS — S72352A Displaced comminuted fracture of shaft of left femur, initial encounter for closed fracture: Principal | ICD-10-CM | POA: Diagnosis present

## 2018-08-26 DIAGNOSIS — E039 Hypothyroidism, unspecified: Secondary | ICD-10-CM | POA: Diagnosis present

## 2018-08-26 DIAGNOSIS — I2510 Atherosclerotic heart disease of native coronary artery without angina pectoris: Secondary | ICD-10-CM | POA: Diagnosis present

## 2018-08-26 DIAGNOSIS — H538 Other visual disturbances: Secondary | ICD-10-CM | POA: Diagnosis present

## 2018-08-26 DIAGNOSIS — S72302A Unspecified fracture of shaft of left femur, initial encounter for closed fracture: Secondary | ICD-10-CM | POA: Diagnosis present

## 2018-08-26 DIAGNOSIS — G8929 Other chronic pain: Secondary | ICD-10-CM | POA: Diagnosis present

## 2018-08-26 DIAGNOSIS — D62 Acute posthemorrhagic anemia: Secondary | ICD-10-CM | POA: Diagnosis not present

## 2018-08-26 DIAGNOSIS — S0240FA Zygomatic fracture, left side, initial encounter for closed fracture: Secondary | ICD-10-CM | POA: Diagnosis present

## 2018-08-26 DIAGNOSIS — H547 Unspecified visual loss: Secondary | ICD-10-CM | POA: Diagnosis present

## 2018-08-26 DIAGNOSIS — S0081XA Abrasion of other part of head, initial encounter: Secondary | ICD-10-CM | POA: Diagnosis present

## 2018-08-26 DIAGNOSIS — Z79899 Other long term (current) drug therapy: Secondary | ICD-10-CM | POA: Diagnosis not present

## 2018-08-26 DIAGNOSIS — T148XXA Other injury of unspecified body region, initial encounter: Secondary | ICD-10-CM

## 2018-08-26 DIAGNOSIS — H9201 Otalgia, right ear: Secondary | ICD-10-CM | POA: Diagnosis present

## 2018-08-26 DIAGNOSIS — Z955 Presence of coronary angioplasty implant and graft: Secondary | ICD-10-CM | POA: Diagnosis not present

## 2018-08-26 DIAGNOSIS — S02842A Fracture of lateral orbital wall, left side, initial encounter for closed fracture: Secondary | ICD-10-CM | POA: Diagnosis present

## 2018-08-26 DIAGNOSIS — S7292XA Unspecified fracture of left femur, initial encounter for closed fracture: Secondary | ICD-10-CM

## 2018-08-26 HISTORY — DX: Non-ST elevation (NSTEMI) myocardial infarction: I21.4

## 2018-08-26 HISTORY — DX: Heart failure, unspecified: I50.9

## 2018-08-26 LAB — CBC
HCT: 43.6 % (ref 39.0–52.0)
Hemoglobin: 13.8 g/dL (ref 13.0–17.0)
MCH: 30.9 pg (ref 26.0–34.0)
MCHC: 31.7 g/dL (ref 30.0–36.0)
MCV: 97.5 fL (ref 80.0–100.0)
Platelets: 260 10*3/uL (ref 150–400)
RBC: 4.47 MIL/uL (ref 4.22–5.81)
RDW: 12.7 % (ref 11.5–15.5)
WBC: 11.8 10*3/uL — ABNORMAL HIGH (ref 4.0–10.5)
nRBC: 0 % (ref 0.0–0.2)

## 2018-08-26 LAB — I-STAT CHEM 8, ED
BUN: 34 mg/dL — ABNORMAL HIGH (ref 6–20)
Calcium, Ion: 0.87 mmol/L — CL (ref 1.15–1.40)
Chloride: 104 mmol/L (ref 98–111)
Creatinine, Ser: 0.9 mg/dL (ref 0.61–1.24)
Glucose, Bld: 94 mg/dL (ref 70–99)
HCT: 42 % (ref 39.0–52.0)
Hemoglobin: 14.3 g/dL (ref 13.0–17.0)
Potassium: 6.4 mmol/L (ref 3.5–5.1)
Sodium: 134 mmol/L — ABNORMAL LOW (ref 135–145)
TCO2: 30 mmol/L (ref 22–32)

## 2018-08-26 LAB — COMPREHENSIVE METABOLIC PANEL
ALK PHOS: 60 U/L (ref 38–126)
ALT: 16 U/L (ref 0–44)
ANION GAP: 8 (ref 5–15)
AST: 27 U/L (ref 15–41)
Albumin: 3.9 g/dL (ref 3.5–5.0)
BILIRUBIN TOTAL: 0.3 mg/dL (ref 0.3–1.2)
BUN: 21 mg/dL — AB (ref 6–20)
CO2: 27 mmol/L (ref 22–32)
Calcium: 9 mg/dL (ref 8.9–10.3)
Chloride: 102 mmol/L (ref 98–111)
Creatinine, Ser: 0.92 mg/dL (ref 0.61–1.24)
GFR calc Af Amer: >60 mL/min (ref >60)
GFR calc non Af Amer: >60 mL/min (ref >60)
Glucose, Bld: 99 mg/dL (ref 70–99)
Potassium: 4 mmol/L (ref 3.5–5.1)
Sodium: 137 mmol/L (ref 135–145)
Total Protein: 6.8 g/dL (ref 6.5–8.1)

## 2018-08-26 LAB — RAPID URINE DRUG SCREEN, HOSP PERFORMED
Amphetamines: NOT DETECTED
Barbiturates: NOT DETECTED
Benzodiazepines: NOT DETECTED
Cocaine: NOT DETECTED
OPIATES: POSITIVE — AB
TETRAHYDROCANNABINOL: NOT DETECTED

## 2018-08-26 LAB — URINALYSIS, ROUTINE W REFLEX MICROSCOPIC
Bilirubin Urine: NEGATIVE
Glucose, UA: NEGATIVE mg/dL
Hgb urine dipstick: NEGATIVE
Ketones, ur: NEGATIVE mg/dL
Leukocytes, UA: NEGATIVE
Nitrite: NEGATIVE
Protein, ur: NEGATIVE mg/dL
Specific Gravity, Urine: 1.02 (ref 1.005–1.030)
pH: 7 (ref 5.0–8.0)

## 2018-08-26 LAB — I-STAT CG4 LACTIC ACID, ED: Lactic Acid, Venous: 1.51 mmol/L (ref 0.5–1.9)

## 2018-08-26 LAB — PROTIME-INR
INR: 1.04
Prothrombin Time: 13.5 seconds (ref 11.4–15.2)

## 2018-08-26 LAB — CDS SEROLOGY

## 2018-08-26 LAB — ETHANOL: Alcohol, Ethyl (B): <10 mg/dL (ref <10)

## 2018-08-26 LAB — SAMPLE TO BLOOD BANK

## 2018-08-26 MED ORDER — GABAPENTIN 300 MG PO CAPS
900.0000 mg | ORAL_CAPSULE | Freq: Three times a day (TID) | ORAL | Status: DC
  Administered 2018-08-27 – 2018-09-05 (×27): 900 mg via ORAL
  Filled 2018-08-26 (×26): qty 3

## 2018-08-26 MED ORDER — HYDROMORPHONE HCL 1 MG/ML IJ SOLN
0.5000 mg | Freq: Once | INTRAMUSCULAR | Status: AC
  Administered 2018-08-26: 0.5 mg via INTRAVENOUS
  Filled 2018-08-26: qty 1

## 2018-08-26 MED ORDER — POTASSIUM CHLORIDE CRYS ER 20 MEQ PO TBCR
20.0000 meq | EXTENDED_RELEASE_TABLET | Freq: Every day | ORAL | Status: DC
  Administered 2018-08-28 – 2018-09-05 (×8): 20 meq via ORAL
  Filled 2018-08-26 (×8): qty 1

## 2018-08-26 MED ORDER — LISINOPRIL 2.5 MG PO TABS
2.5000 mg | ORAL_TABLET | Freq: Every day | ORAL | Status: DC
  Administered 2018-08-28 – 2018-09-05 (×7): 2.5 mg via ORAL
  Filled 2018-08-26 (×8): qty 1

## 2018-08-26 MED ORDER — LORAZEPAM 0.5 MG PO TABS
0.5000 mg | ORAL_TABLET | ORAL | Status: DC | PRN
  Administered 2018-08-27 – 2018-08-31 (×2): 0.5 mg via SUBLINGUAL
  Filled 2018-08-26 (×2): qty 1

## 2018-08-26 MED ORDER — PANTOPRAZOLE SODIUM 40 MG IV SOLR: 40.0000 mg | Freq: Every day | INTRAVENOUS | Status: DC

## 2018-08-26 MED ORDER — FENTANYL CITRATE (PF) 100 MCG/2ML IJ SOLN
INTRAMUSCULAR | Status: AC | PRN
  Administered 2018-08-26: 100 ug via INTRAVENOUS

## 2018-08-26 MED ORDER — MORPHINE SULFATE ER 30 MG PO TBCR
30.0000 mg | EXTENDED_RELEASE_TABLET | Freq: Three times a day (TID) | ORAL | Status: DC
  Administered 2018-08-27 – 2018-09-05 (×25): 30 mg via ORAL
  Filled 2018-08-26 (×27): qty 1

## 2018-08-26 MED ORDER — SERTRALINE HCL 50 MG PO TABS
50.0000 mg | ORAL_TABLET | Freq: Every day | ORAL | Status: DC
  Administered 2018-08-28 – 2018-09-05 (×8): 50 mg via ORAL
  Filled 2018-08-26 (×8): qty 1

## 2018-08-26 MED ORDER — OXYCODONE HCL 5 MG PO TABS
10.0000 mg | ORAL_TABLET | Freq: Four times a day (QID) | ORAL | Status: DC | PRN
  Administered 2018-08-27 – 2018-09-05 (×22): 10 mg via ORAL
  Filled 2018-08-26 (×23): qty 2

## 2018-08-26 MED ORDER — ENOXAPARIN SODIUM 40 MG/0.4ML ~~LOC~~ SOLN: 40.0000 mg | Freq: Every day | SUBCUTANEOUS | Status: DC

## 2018-08-26 MED ORDER — MORPHINE SULFATE (CONCENTRATE) 10 MG/0.5ML PO SOLN
10.0000 mg | ORAL | Status: DC | PRN
  Administered 2018-08-27 – 2018-09-05 (×7): 10 mg via ORAL
  Filled 2018-08-26 (×8): qty 0.5

## 2018-08-26 MED ORDER — SODIUM CHLORIDE 0.9 % IV SOLN
1.0000 g | Freq: Once | INTRAVENOUS | Status: AC
  Administered 2018-08-26: 1 g via INTRAVENOUS
  Filled 2018-08-26: qty 1000

## 2018-08-26 MED ORDER — HYDROMORPHONE HCL 1 MG/ML IJ SOLN
1.0000 mg | INTRAMUSCULAR | Status: DC | PRN
  Administered 2018-08-27 (×2): 1 mg via INTRAVENOUS
  Filled 2018-08-26 (×2): qty 1

## 2018-08-26 MED ORDER — CARVEDILOL 6.25 MG PO TABS
6.2500 mg | ORAL_TABLET | Freq: Two times a day (BID) | ORAL | Status: DC
  Administered 2018-08-27 – 2018-09-05 (×15): 6.25 mg via ORAL
  Filled 2018-08-26 (×6): qty 1
  Filled 2018-08-26: qty 2
  Filled 2018-08-26 (×11): qty 1

## 2018-08-26 MED ORDER — FENTANYL CITRATE (PF) 100 MCG/2ML IJ SOLN
INTRAMUSCULAR | Status: AC
  Filled 2018-08-26: qty 2

## 2018-08-26 MED ORDER — PANTOPRAZOLE SODIUM 40 MG PO TBEC
40.0000 mg | DELAYED_RELEASE_TABLET | Freq: Every day | ORAL | Status: DC
  Administered 2018-08-28 – 2018-09-05 (×8): 40 mg via ORAL
  Filled 2018-08-26 (×8): qty 1

## 2018-08-26 MED ORDER — ONDANSETRON HCL 4 MG/2ML IJ SOLN: 4.0000 mg | Freq: Four times a day (QID) | INTRAMUSCULAR | Status: DC | PRN

## 2018-08-26 MED ORDER — FUROSEMIDE 40 MG PO TABS
40.0000 mg | ORAL_TABLET | Freq: Every day | ORAL | Status: DC
  Administered 2018-08-28 – 2018-09-05 (×7): 40 mg via ORAL
  Filled 2018-08-26 (×8): qty 1

## 2018-08-26 MED ORDER — ONDANSETRON 4 MG PO TBDP: 4.0000 mg | ORAL_TABLET | Freq: Four times a day (QID) | ORAL | Status: DC | PRN

## 2018-08-26 MED ORDER — POTASSIUM CHLORIDE IN NACL 20-0.9 MEQ/L-% IV SOLN
INTRAVENOUS | Status: DC
  Administered 2018-08-27 (×2): via INTRAVENOUS
  Filled 2018-08-26 (×3): qty 1000

## 2018-08-26 NOTE — ED Notes (Signed)
Transported to CT scanner

## 2018-08-26 NOTE — Progress Notes (Signed)
I have discussed this case with Dr. Doreatha Martin and he will take patient to surgery in the morning.   NPO after midnight. [employer] traction ordered. Trauma work up pending.
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
## 2018-08-26 NOTE — ED Notes (Signed)
Pt moved to hospital bed for placement of Bucks Traction by OT.

## 2018-08-26 NOTE — ED Notes (Signed)
Ortho Tech at Bedside.

## 2018-08-26 NOTE — Progress Notes (Signed)
Orthopedic Tech Progress Note Patient Details:  Christopher Meyers 1966/01/17 [id number]  Musculoskeletal Traction Type of Traction: Bucks Skin Traction Traction Weight: 15 lbs   Post Interventions Patient Tolerated: Well Instructions Provided: Care of device   [name] 08/26/2018, 8:41 PM
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
## 2018-08-26 NOTE — ED Notes (Signed)
Brother in Sports coach at the bedside. Update has been given.

## 2018-08-27 ENCOUNTER — Inpatient Hospital Stay (HOSPITAL_COMMUNITY): Payer: Medicaid Other

## 2018-08-27 ENCOUNTER — Inpatient Hospital Stay (HOSPITAL_COMMUNITY): Payer: Medicaid Other | Admitting: Certified Registered Nurse Anesthetist

## 2018-08-27 ENCOUNTER — Encounter (HOSPITAL_COMMUNITY): Payer: Self-pay

## 2018-08-27 ENCOUNTER — Encounter (HOSPITAL_COMMUNITY): Admission: EM | Disposition: A | Discharge status: Home / Self Care | Payer: Self-pay | Source: Home / Self Care

## 2018-08-27 ENCOUNTER — Other Ambulatory Visit: Payer: Self-pay

## 2018-08-27 HISTORY — PX: FEMUR IM NAIL: SHX1597

## 2018-08-27 LAB — BASIC METABOLIC PANEL
Anion gap: 9 (ref 5–15)
BUN: 18 mg/dL (ref 6–20)
CO2: 23 mmol/L (ref 22–32)
Calcium: 8.8 mg/dL — ABNORMAL LOW (ref 8.9–10.3)
Chloride: 104 mmol/L (ref 98–111)
Creatinine, Ser: 0.8 mg/dL (ref 0.61–1.24)
GFR calc Af Amer: >60 mL/min (ref >60)
GFR calc non Af Amer: >60 mL/min (ref >60)
Glucose, Bld: 131 mg/dL — ABNORMAL HIGH (ref 70–99)
Potassium: 4.1 mmol/L (ref 3.5–5.1)
Sodium: 136 mmol/L (ref 135–145)

## 2018-08-27 LAB — HIV ANTIBODY (ROUTINE TESTING W REFLEX): HIV Screen 4th Generation wRfx: NONREACTIVE

## 2018-08-27 LAB — CBC
HEMATOCRIT: 40.7 % (ref 39.0–52.0)
Hemoglobin: 13.2 g/dL (ref 13.0–17.0)
MCH: 30.4 pg (ref 26.0–34.0)
MCHC: 32.4 g/dL (ref 30.0–36.0)
MCV: 93.8 fL (ref 80.0–100.0)
Platelets: 274 10*3/uL (ref 150–400)
RBC: 4.34 MIL/uL (ref 4.22–5.81)
RDW: 12.6 % (ref 11.5–15.5)
WBC: 12.5 10*3/uL — ABNORMAL HIGH (ref 4.0–10.5)
nRBC: 0 % (ref 0.0–0.2)

## 2018-08-27 LAB — POCT I-STAT, CHEM 8
BUN: 34 mg/dL — ABNORMAL HIGH (ref 6–20)
Calcium, Ion: 0.87 mmol/L — CL (ref 1.15–1.40)
Chloride: 104 mmol/L (ref 98–111)
Creatinine, Ser: 0.9 mg/dL (ref 0.61–1.24)
Glucose, Bld: 94 mg/dL (ref 70–99)
HCT: 42 % (ref 39.0–52.0)
Hemoglobin: 14.3 g/dL (ref 13.0–17.0)
Potassium: 6.4 mmol/L (ref 3.5–5.1)
Sodium: 134 mmol/L — ABNORMAL LOW (ref 135–145)
TCO2: 30 mmol/L (ref 22–32)

## 2018-08-27 LAB — SURGICAL PCR SCREEN
MRSA, PCR: NEGATIVE
Staphylococcus aureus: NEGATIVE

## 2018-08-27 SURGERY — INSERTION, INTRAMEDULLARY ROD, FEMUR
Anesthesia: General | Site: Hip | Laterality: Left

## 2018-08-27 MED ORDER — LACTATED RINGERS IV SOLN
INTRAVENOUS | Status: DC | PRN
  Administered 2018-08-27 (×2): via INTRAVENOUS

## 2018-08-27 MED ORDER — FENTANYL CITRATE (PF) 100 MCG/2ML IJ SOLN
INTRAMUSCULAR | Status: DC | PRN
  Administered 2018-08-27 (×6): 50 ug via INTRAVENOUS

## 2018-08-27 MED ORDER — FENTANYL CITRATE (PF) 100 MCG/2ML IJ SOLN
INTRAMUSCULAR | Status: AC
  Filled 2018-08-27: qty 2

## 2018-08-27 MED ORDER — HYDROMORPHONE HCL 1 MG/ML IJ SOLN
0.2500 mg | INTRAMUSCULAR | Status: DC | PRN
  Administered 2018-08-27 (×4): 0.5 mg via INTRAVENOUS

## 2018-08-27 MED ORDER — SUGAMMADEX SODIUM 200 MG/2ML IV SOLN
INTRAVENOUS | Status: DC | PRN
  Administered 2018-08-27: 130 mg via INTRAVENOUS

## 2018-08-27 MED ORDER — CEFAZOLIN SODIUM-DEXTROSE 2-4 GM/100ML-% IV SOLN
2.0000 g | Freq: Once | INTRAVENOUS | Status: AC
  Administered 2018-08-27: 2 g via INTRAVENOUS

## 2018-08-27 MED ORDER — PHENYLEPHRINE HCL 10 MG/ML IJ SOLN
INTRAMUSCULAR | Status: DC | PRN
  Administered 2018-08-27: 80 ug via INTRAVENOUS
  Administered 2018-08-27 (×3): 40 ug via INTRAVENOUS
  Administered 2018-08-27: 80 ug via INTRAVENOUS
  Administered 2018-08-27 (×2): 40 ug via INTRAVENOUS
  Administered 2018-08-27: 80 ug via INTRAVENOUS

## 2018-08-27 MED ORDER — MIDAZOLAM HCL 2 MG/2ML IJ SOLN
INTRAMUSCULAR | Status: AC
  Filled 2018-08-27: qty 2

## 2018-08-27 MED ORDER — ONDANSETRON HCL 4 MG/2ML IJ SOLN: 4.0000 mg | Freq: Once | INTRAMUSCULAR | Status: DC | PRN

## 2018-08-27 MED ORDER — ESMOLOL HCL 100 MG/10ML IV SOLN
INTRAVENOUS | Status: DC | PRN
  Administered 2018-08-27 (×3): 10 mg via INTRAVENOUS

## 2018-08-27 MED ORDER — CEFAZOLIN SODIUM-DEXTROSE 2-4 GM/100ML-% IV SOLN
2.0000 g | Freq: Three times a day (TID) | INTRAVENOUS | Status: AC
  Administered 2018-08-27 – 2018-08-28 (×3): 2 g via INTRAVENOUS
  Filled 2018-08-27 (×4): qty 100

## 2018-08-27 MED ORDER — MIDAZOLAM HCL 5 MG/5ML IJ SOLN
INTRAMUSCULAR | Status: DC | PRN
  Administered 2018-08-27 (×2): 1 mg via INTRAVENOUS

## 2018-08-27 MED ORDER — CEFAZOLIN SODIUM-DEXTROSE 2-4 GM/100ML-% IV SOLN
INTRAVENOUS | Status: AC
  Filled 2018-08-27: qty 100

## 2018-08-27 MED ORDER — LACTATED RINGERS IV SOLN
INTRAVENOUS | Status: DC
  Administered 2018-08-27 – 2018-09-04 (×3): via INTRAVENOUS

## 2018-08-27 MED ORDER — 0.9 % SODIUM CHLORIDE (POUR BTL) OPTIME
TOPICAL | Status: DC | PRN
  Administered 2018-08-27: 1000 mL

## 2018-08-27 MED ORDER — HYDROMORPHONE HCL 1 MG/ML IJ SOLN
INTRAMUSCULAR | Status: AC
  Filled 2018-08-27: qty 2

## 2018-08-27 MED ORDER — OXYCODONE HCL 5 MG PO TABS
ORAL_TABLET | ORAL | Status: AC
  Filled 2018-08-27: qty 1

## 2018-08-27 MED ORDER — LACTATED RINGERS IV SOLN: INTRAVENOUS | Status: DC | PRN

## 2018-08-27 MED ORDER — NOREPINEPHRINE 4 MG/250ML-% IV SOLN
0.0000 ug/min | INTRAVENOUS | Status: DC
  Filled 2018-08-27: qty 250

## 2018-08-27 MED ORDER — ENOXAPARIN SODIUM 40 MG/0.4ML ~~LOC~~ SOLN
40.0000 mg | SUBCUTANEOUS | Status: DC
  Administered 2018-08-28 – 2018-09-05 (×8): 40 mg via SUBCUTANEOUS
  Filled 2018-08-27 (×8): qty 0.4

## 2018-08-27 MED ORDER — OXYCODONE HCL 5 MG PO TABS
5.0000 mg | ORAL_TABLET | Freq: Once | ORAL | Status: AC | PRN
  Administered 2018-08-27: 5 mg via ORAL

## 2018-08-27 MED ORDER — HYDROMORPHONE HCL 1 MG/ML IJ SOLN
INTRAMUSCULAR | Status: DC | PRN
  Administered 2018-08-27: 0.5 mg via INTRAVENOUS

## 2018-08-27 MED ORDER — OXYCODONE HCL 5 MG/5ML PO SOLN: 5.0000 mg | Freq: Once | ORAL | Status: DC | PRN

## 2018-08-27 MED ORDER — NOREPINEPHRINE BITARTRATE 1 MG/ML IV SOLN
INTRAVENOUS | Status: DC | PRN
  Administered 2018-08-27: 2 ug/min via INTRAVENOUS

## 2018-08-27 MED ORDER — ROPIVACAINE HCL 5 MG/ML IJ SOLN
INTRAMUSCULAR | Status: DC | PRN
  Administered 2018-08-27: 30 mL via PERINEURAL

## 2018-08-27 MED ORDER — ROCURONIUM BROMIDE 100 MG/10ML IV SOLN
INTRAVENOUS | Status: DC | PRN
  Administered 2018-08-27: 50 mg via INTRAVENOUS
  Administered 2018-08-27: 10 mg via INTRAVENOUS

## 2018-08-27 MED ORDER — DEXAMETHASONE SODIUM PHOSPHATE 10 MG/ML IJ SOLN
INTRAMUSCULAR | Status: DC | PRN
  Administered 2018-08-27: 6 mg via INTRAVENOUS

## 2018-08-27 MED ORDER — PROPOFOL 10 MG/ML IV BOLUS
INTRAVENOUS | Status: DC | PRN
  Administered 2018-08-27: 30 mg via INTRAVENOUS
  Administered 2018-08-27 (×2): 20 mg via INTRAVENOUS
  Administered 2018-08-27: 30 mg via INTRAVENOUS

## 2018-08-27 MED ORDER — VANCOMYCIN HCL 1000 MG IV SOLR
INTRAVENOUS | Status: AC
  Filled 2018-08-27: qty 1000

## 2018-08-27 MED ORDER — MUPIROCIN 2 % EX OINT
1.0000 "application " | TOPICAL_OINTMENT | Freq: Two times a day (BID) | CUTANEOUS | Status: AC
  Administered 2018-08-27 – 2018-08-30 (×7): 1 via NASAL
  Filled 2018-08-27 (×3): qty 22

## 2018-08-27 MED ORDER — WHITE PETROLATUM EX OINT
TOPICAL_OINTMENT | CUTANEOUS | Status: AC
  Administered 2018-08-27: 03:00:00
  Filled 2018-08-27: qty 28.35

## 2018-08-27 MED ORDER — ONDANSETRON HCL 4 MG/2ML IJ SOLN
INTRAMUSCULAR | Status: DC | PRN
  Administered 2018-08-27: 4 mg via INTRAVENOUS

## 2018-08-27 MED ORDER — VANCOMYCIN HCL 1000 MG IV SOLR
INTRAVENOUS | Status: DC | PRN
  Administered 2018-08-27: 1000 mg via TOPICAL

## 2018-08-27 SURGICAL SUPPLY — 66 items
BANDAGE ACE 4X5 VEL STRL LF (GAUZE/BANDAGES/DRESSINGS) ×3 IMPLANT
BANDAGE ACE 6X5 VEL STRL LF (GAUZE/BANDAGES/DRESSINGS) ×6 IMPLANT
BANDAGE ELASTIC 4 VELCRO ST LF (GAUZE/BANDAGES/DRESSINGS) ×3 IMPLANT
BANDAGE ELASTIC 6 VELCRO ST LF (GAUZE/BANDAGES/DRESSINGS) ×3 IMPLANT
BIT DRILL SHORT 4.2 (BIT) ×2 IMPLANT
BLADE SURG 10 STRL SS (BLADE) ×6 IMPLANT
BNDG COHESIVE 4X5 TAN STRL (GAUZE/BANDAGES/DRESSINGS) ×3 IMPLANT
BNDG COHESIVE 6X5 TAN STRL LF (GAUZE/BANDAGES/DRESSINGS) ×3 IMPLANT
BNDG ELASTIC 6X10 VLCR STRL LF (GAUZE/BANDAGES/DRESSINGS) ×3 IMPLANT
BRUSH SCRUB SURG 4.25 DISP (MISCELLANEOUS) ×3 IMPLANT
CHLORAPREP W/TINT 26ML (MISCELLANEOUS) ×3 IMPLANT
COVER SURGICAL LIGHT HANDLE (MISCELLANEOUS) ×3 IMPLANT
COVER WAND RF STERILE (DRAPES) IMPLANT
DERMABOND ADVANCED (GAUZE/BANDAGES/DRESSINGS) ×2
DERMABOND ADVANCED .7 DNX12 (GAUZE/BANDAGES/DRESSINGS) ×1 IMPLANT
DRAPE C-ARM 35X43 STRL (DRAPES) ×3 IMPLANT
DRAPE C-ARMOR (DRAPES) ×3 IMPLANT
DRAPE HALF SHEET 40X57 (DRAPES) ×6 IMPLANT
DRAPE IMP U-DRAPE 54X76 (DRAPES) ×6 IMPLANT
DRAPE INCISE IOBAN 66X45 STRL (DRAPES) ×3 IMPLANT
DRAPE ORTHO SPLIT 77X108 STRL (DRAPES) ×4
DRAPE SURG 17X23 STRL (DRAPES) ×3 IMPLANT
DRAPE SURG ORHT 6 SPLT 77X108 (DRAPES) ×2 IMPLANT
DRAPE U-SHAPE 47X51 STRL (DRAPES) ×3 IMPLANT
DRILL BIT SHORT 4.2 (BIT) ×4
DRSG MEPILEX BORDER 4X4 (GAUZE/BANDAGES/DRESSINGS) ×3 IMPLANT
DRSG MEPILEX BORDER 4X8 (GAUZE/BANDAGES/DRESSINGS) ×3 IMPLANT
ELECT REM PT RETURN 9FT ADLT (ELECTROSURGICAL) ×3
ELECTRODE REM PT RTRN 9FT ADLT (ELECTROSURGICAL) ×1 IMPLANT
GLOVE BIO SURGEON STRL SZ 6.5 (GLOVE) ×8 IMPLANT
GLOVE BIO SURGEON STRL SZ7.5 (GLOVE) ×9 IMPLANT
GLOVE BIO SURGEONS STRL SZ 6.5 (GLOVE) ×4
GLOVE BIOGEL PI IND STRL 6.5 (GLOVE) ×4 IMPLANT
GLOVE BIOGEL PI IND STRL 7.5 (GLOVE) ×1 IMPLANT
GLOVE BIOGEL PI INDICATOR 6.5 (GLOVE) ×8
GLOVE BIOGEL PI INDICATOR 7.5 (GLOVE) ×2
GOWN STRL REUS W/ TWL LRG LVL3 (GOWN DISPOSABLE) ×3 IMPLANT
GOWN STRL REUS W/TWL LRG LVL3 (GOWN DISPOSABLE) ×6
GOWN STRL REUS W/TWL XL LVL3 (GOWN DISPOSABLE) ×3 IMPLANT
GUIDEWIRE 3.2X400 (WIRE) ×9 IMPLANT
KIT BASIN OR (CUSTOM PROCEDURE TRAY) ×3 IMPLANT
KIT TURNOVER KIT B (KITS) ×3 IMPLANT
NAIL CANN FRN TI 11X400 (Nail) ×3 IMPLANT
NS IRRIG 1000ML POUR BTL (IV SOLUTION) ×3 IMPLANT
PACK GENERAL/GYN (CUSTOM PROCEDURE TRAY) ×3 IMPLANT
PAD ARMBOARD 7.5X6 YLW CONV (MISCELLANEOUS) ×6 IMPLANT
PAD CAST 4YDX4 CTTN HI CHSV (CAST SUPPLIES) ×1 IMPLANT
PADDING CAST COTTON 4X4 STRL (CAST SUPPLIES) ×2
PADDING CAST COTTON 6X4 STRL (CAST SUPPLIES) ×3 IMPLANT
PIN TROCAR POINT 2MM-1 (PIN) ×3 IMPLANT
REAMER ROD DEEP FLUTE 2.5X950 (INSTRUMENTS) ×3 IMPLANT
SCREW 6.5MM W/T25 STARDRIVE (Screw) ×3 IMPLANT
SCREW LOCK STAR 5X42 (Screw) ×3 IMPLANT
SCREW LOCK STAR 5X44 (Screw) ×3 IMPLANT
SCREW RECON 6.5X100MM (Screw) ×3 IMPLANT
STAPLER VISISTAT 35W (STAPLE) ×3 IMPLANT
STOCKINETTE IMPERVIOUS LG (DRAPES) ×3 IMPLANT
SUT ETHILON 3 0 PS 1 (SUTURE) IMPLANT
SUT MNCRL AB 3-0 PS2 18 (SUTURE) ×6 IMPLANT
SUT VIC AB 0 CT1 27 (SUTURE) ×4
SUT VIC AB 0 CT1 27XBRD ANBCTR (SUTURE) ×2 IMPLANT
SUT VIC AB 2-0 CT1 27 (SUTURE) ×6
SUT VIC AB 2-0 CT1 TAPERPNT 27 (SUTURE) ×3 IMPLANT
TOWEL OR 17X24 6PK STRL BLUE (TOWEL DISPOSABLE) ×3 IMPLANT
TOWEL OR 17X26 10 PK STRL BLUE (TOWEL DISPOSABLE) ×6 IMPLANT
UNDERPAD 30X30 (UNDERPADS AND DIAPERS) ×3 IMPLANT
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
## 2018-08-27 NOTE — Progress Notes (Signed)
Verified armband matches patient's stated name and birth date. Verified NPO status and that all jewelry, contact, glasses, dentures, and partials had been removed (if applicable). Waiting on Dr. Doreatha Martin to speak to patient and provide orders for consent.

## 2018-08-27 NOTE — Anesthesia Procedure Notes (Addendum)
Arterial Line Insertion Performed by: Josephine Igo, CRNA, CRNA  Preanesthetic checklist: patient identified, IV checked, surgical consent, monitors and equipment checked, pre-op evaluation and anesthesia consent Lidocaine 1% used for infiltration and patient sedated Right, radial was placed Catheter size: 20 G Hand hygiene performed , maximum sterile barriers used  and Seldinger technique used  Attempts: 4 Procedure performed without using ultrasound guided technique. Following insertion, dressing applied and Biopatch. Post procedure assessment: normal and numbness  Patient tolerated the procedure well with no immediate complications. Additional procedure comments: Right Radial attempt x3- A. Advertising copywriter.
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
## 2018-08-27 NOTE — Progress Notes (Signed)
Pt alert and oriented x4,complaints of pain and discomfort.  Bed in low position, call bell within reach.  Bed alarms on and functioning.  Assessment done and charted.  Pt leaving for the OR
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
## 2018-08-27 NOTE — Progress Notes (Signed)
Consent signed and witnessed by wife at bedside due to narcotic administration.

## 2018-08-27 NOTE — Transfer of Care (Signed)
Immediate Anesthesia Transfer of Care Note  Patient: Christopher Meyers  Procedure(s) Performed: INTRAMEDULLARY (IM) NAIL FEMORAL (Left Hip)  Patient Location: PACU  Anesthesia Type:General  Level of Consciousness: awake, alert  and patient cooperative  Airway & Oxygen Therapy: Patient Spontanous Breathing  Post-op Assessment: Report given to RN and Post -op Vital signs reviewed and stable  Post vital signs: Reviewed and stable  Last Vitals:  Vitals Value Taken Time  BP 106/89 08/27/2018  1:44 PM  Temp    Pulse 106 08/27/2018  1:46 PM  Resp 22 08/27/2018  1:46 PM  SpO2 100 % 08/27/2018  1:46 PM  Vitals shown include unvalidated device data.  Last Pain:  Vitals:   08/27/18 0341  TempSrc: Oral  PainSc:          Complications: No apparent anesthesia complications

## 2018-08-27 NOTE — Anesthesia Postprocedure Evaluation (Signed)
Anesthesia Post Note  Patient: Christopher Meyers  Procedure(s) Performed: INTRAMEDULLARY (IM) NAIL FEMORAL (Left Hip)     Patient location during evaluation: PACU Anesthesia Type: General Level of consciousness: awake and alert Pain management: pain level controlled Vital Signs Assessment: post-procedure vital signs reviewed and stable Respiratory status: spontaneous breathing, nonlabored ventilation and respiratory function stable Cardiovascular status: blood pressure returned to baseline and stable Postop Assessment: no apparent nausea or vomiting Anesthetic complications: no    Last Vitals:  Vitals:   08/27/18 1418 08/27/18 1431  BP: 109/88 94/78  Pulse: 87 91  Resp: 13   Temp: 36.5 C 36.9 C  SpO2: 100% (!) 75%    Last Pain:  Vitals:   08/27/18 1431  TempSrc: Oral  PainSc:                  Lidia Collum
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
## 2018-08-27 NOTE — Anesthesia Procedure Notes (Addendum)
Anesthesia Regional Block: Femoral nerve block   Pre-Anesthetic Checklist: ,, timeout performed, Correct Patient, Correct Site, Correct Laterality, Correct Procedure, Correct Position, site marked, Risks and benefits discussed,  Surgical consent,  Pre-op evaluation,  At surgeon's request and post-op pain management  Laterality: Left  Prep: chloraprep       Needles:  Injection technique: Single-shot  Needle Type: Echogenic Stimulator Needle     Needle Length: 9cm  Needle Gauge: 21     Additional Needles:   Procedures:,,,, ultrasound used (permanent image in chart),,,,  Narrative:  Start time: 08/27/2018 10:35 AM End time: 08/27/2018 10:41 AM Injection made incrementally with aspirations every 5 mL.  Performed by: Personally  Anesthesiologist: Lidia Collum, MD  Additional Notes: Monitors applied. Injection made in 5cc increments. No resistance to injection. Good needle visualization. Patient tolerated procedure well.

## 2018-08-27 NOTE — Anesthesia Procedure Notes (Signed)
Procedure Name: Intubation Date/Time: 08/27/2018 10:53 AM Performed by: Gayland Curry, CRNA Pre-anesthesia Checklist: Patient identified, Emergency Drugs available, Suction available and Patient being monitored Patient Re-evaluated:Patient Re-evaluated prior to induction Oxygen Delivery Method: Circle system utilized Preoxygenation: Pre-oxygenation with 100% oxygen Induction Type: IV induction Ventilation: Mask ventilation without difficulty and Oral airway inserted - appropriate to patient size Laryngoscope Size: Mac and 4 Grade View: Grade I Tube type: Oral Tube size: 7.0 mm Number of attempts: 1 Placement Confirmation: ETT inserted through vocal cords under direct vision,  positive ETCO2 and breath sounds checked- equal and bilateral Secured at: 21 cm Tube secured with: Tape Dental Injury: Teeth and Oropharynx as per pre-operative assessment

## 2018-08-28 ENCOUNTER — Encounter (HOSPITAL_COMMUNITY): Payer: Self-pay | Admitting: Student

## 2018-08-28 LAB — CBC
HCT: 28.8 % — ABNORMAL LOW (ref 39.0–52.0)
Hemoglobin: 9.3 g/dL — ABNORMAL LOW (ref 13.0–17.0)
MCH: 31 pg (ref 26.0–34.0)
MCHC: 32.3 g/dL (ref 30.0–36.0)
MCV: 96 fL (ref 80.0–100.0)
Platelets: 236 10*3/uL (ref 150–400)
RBC: 3 MIL/uL — AB (ref 4.22–5.81)
RDW: 12.8 % (ref 11.5–15.5)
WBC: 13.6 10*3/uL — ABNORMAL HIGH (ref 4.0–10.5)
nRBC: 0 % (ref 0.0–0.2)

## 2018-08-28 MED ORDER — LIDOCAINE-EPINEPHRINE (PF) 1 %-1:200000 IJ SOLN
0.0000 mL | Freq: Once | INTRAMUSCULAR | Status: DC | PRN
  Filled 2018-08-28 (×2): qty 30

## 2018-08-28 MED ORDER — LIDOCAINE HCL URETHRAL/MUCOSAL 2 % EX GEL
1.0000 "application " | Freq: Once | CUTANEOUS | Status: DC | PRN
  Filled 2018-08-28 (×2): qty 30

## 2018-08-28 MED ORDER — LIDOCAINE-EPINEPHRINE 1 %-1:100000 IJ SOLN
0.0000 mL | Freq: Once | INTRAMUSCULAR | Status: DC | PRN
  Filled 2018-08-28: qty 20

## 2018-08-28 MED ORDER — LIDOCAINE HCL 4 % EX SOLN
0.0000 mL | Freq: Once | CUTANEOUS | Status: DC | PRN
  Filled 2018-08-28: qty 50

## 2018-08-28 MED ORDER — TRIPLE ANTIBIOTIC 3.5-400-5000 EX OINT
1.0000 "application " | TOPICAL_OINTMENT | Freq: Once | CUTANEOUS | Status: DC | PRN
  Filled 2018-08-28: qty 1

## 2018-08-28 MED ORDER — OXYMETAZOLINE HCL 0.05 % NA SOLN
1.0000 | Freq: Once | NASAL | Status: DC | PRN
  Filled 2018-08-28: qty 15

## 2018-08-28 MED ORDER — SILVER NITRATE-POT NITRATE 75-25 % EX MISC
1.0000 | Freq: Once | CUTANEOUS | Status: DC | PRN
  Filled 2018-08-28: qty 1

## 2018-08-28 NOTE — Progress Notes (Signed)
CSW completed SBIRT with patient. SBIRT score 0 - patient denied having had any alcohol in several years. Patient plans to return home with hospice services upon discharge.   Estanislado Emms, LCSW [phone]

## 2018-08-28 NOTE — Progress Notes (Signed)
08/28/2018 10:37 AM  Christopher Meyers [id number]  Hosp day 3    Temp:  [97.7 F (36.5 C)-100 F (37.8 C)] 98.2 F (36.8 C) (12/13 0918) Pulse Rate:  [80-103] 90 (12/13 0918) Resp:  [9-26] 18 (12/13 0918) BP: (94-123)/(57-92) 111/57 (12/13 0918) SpO2:  [75 %-100 %] 100 % (12/13 0918) Arterial Line BP: (121-133)/(75-80) 121/75 (12/12 1415),     Intake/Output Summary (Last 24 hours) at 08/28/2018 1037 Last data filed at 08/28/2018 0557 Gross per 24 hour  Intake 2477 ml  Output 1100 ml  Net 1377 ml    Results for orders placed or performed during the hospital encounter of 08/26/18 (from the past 24 hour(s))  CBC     Status: Abnormal   Collection Time: 08/28/18  1:44 AM  Result Value Ref Range   WBC 13.6 (H) 4.0 - 10.5 K/uL   RBC 3.00 (L) 4.22 - 5.81 MIL/uL   Hemoglobin 9.3 (L) 13.0 - 17.0 g/dL   HCT 28.8 (L) 39.0 - 52.0 %   MCV 96.0 80.0 - 100.0 fL   MCH 31.0 26.0 - 34.0 pg   MCHC 32.3 30.0 - 36.0 g/dL   RDW 12.8 11.5 - 15.5 %   Platelets 236 150 - 400 K/uL   nRBC 0.0 0.0 - 0.2 %    SUBJECTIVE:  Facial pain on left.  Notes less free breathing LEFT nose.   OBJECTIVE:  Facial excoriations dry. LEFT periorbital ecchymoses. Vision subjectively intact.  Pupils small c/w narcotics.  No bony facial step offs.  IMPRESSION:  Minimally displaced LEFT trimalar zygomatic fx.    PLAN:  Instruments for exam not at bedside.  Will re-eval Monday.  May need closed reduction nasal fx which could be performed next Friday as inpatient or outpatient.    [name] [facility]
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
## 2018-08-28 NOTE — Discharge Instructions (Addendum)
Weightbearing as tolerated to left lower extremity No nose blowing x 2 weeks   Opioid Pain Medicine Information Opioids are powerful medicines that are used to treat moderate to severe pain. Opioids should be taken with the supervision of a trained health care provider. They should be taken for the shortest period of time as possible. This is because opioids can be addictive and the longer you take opioids, the greater your risk of addiction (opioid use disorder). What do opioids do? Opioids help to reduce or eliminate pain. When used for short periods of time, they can help you:  Sleep better.  Do better in physical or occupational therapy.  Feel better in the first few days after an injury.  Recover from surgery.  What is a pain treatment plan? A pain treatment plan is an agreement between you and your health care provider. Pain is unique to each person, and treatments vary depending on your condition. To manage your pain successfully, you and your health care provider need to understand each other and work together. To help you do this:  Discuss the goals of your treatment, including how much pain you might expect to have and how you will manage the pain.  Review the risks and benefits of taking opioid medicines for your condition.  Remember that a good treatment plan uses more than one approach and minimizes the chance of side effects.  Be honest about the amount of medicines you take, and about any drug or alcohol use.  Get pain medicine prescriptions from only one care provider.  Keep all follow-up visits as told by your health care provider. This is important.  What instructions should I follow while taking opioid pain medicine? While you are taking the medicine and for 8 hours after you stop taking the medicine, follow these instructions:  Do not drive.  Do not use machinery or power tools.  Do not sign legal documents.  Do not drink alcohol.  Do not take sleeping  pills.  Do not supervise children by yourself.  Do not participate in activities that require climbing or being in high places.  Do not enter a body of water--such as a lake, river, ocean, spa, or swimming pool--unless an adult is nearby who can monitor and help you.  What kinds of side effects can opioids cause? Opioids can cause side effects, such as:  Constipation.  Nausea.  Vomiting.  Drowsiness.  Confusion.  Opioid use disorder.  Breathing difficulties (respiratory depression).  Using opioid pain medicines for longer than 3 days increases your risk of these side effects. Taking opioid pain medicine for a long period of time can affect your ability to do daily tasks. It also puts you at risk for:  Motor vehicle accidents.  Depression.  Suicide.  Heart attack.  Overdose, which can sometimes lead to death.  What are alternative ways to manage pain? Pain can be managed with many types of alternative treatments. Ask your health care provider to refer you to one or more specialists who can help you manage pain through:  Physical or occupational therapy.  Counseling (cognitive behavioral therapy).  Good nutrition.  Biofeedback.  Massage.  Meditation.  Non-opioid medicine.  Following a gentle exercise program.  How can I keep others safe while I am taking opioid pain medicine?  Keep pain medicine in a locked cabinet, or in a secure area where children cannot reach it.  Never share your pain medicine with anyone.  Do not save any leftover pills. If  you have leftover medicine, you can: 1. Bring the medicine to a prescription take-back program. This is usually offered by the county or Event organiser. 2. Throw it out in the trash. To do this:  Mix the medicine with undesirable trash such as pet waste or food.  Put the mixture in a sealed container or plastic bag.  Throw it in the trash.  Destroy any personal information on the prescription  bottle. How do I stop taking opioids if I have been taking them for a long time? If you have been taking opioid medicine for more than a few weeks, you may need to slowly decrease (taper) how much you take until you stop completely. Tapering your use of opioids can decrease your chances of experiencing withdrawal symptoms, such as:  Pain and cramping in the abdomen.  Nausea.  Sweating.  Sleepiness.  Restlessness.  Uncontrollable shaking (tremors).  Cravings for the medicine.  Do not attempt to taper your use of opioids on your own. Talk with your health care provider about how to do this. Your health care provider may prescribe a step-down schedule based on how much medicine you are taking and how long you have been taking it. Where to find support: If you have been taking opioids for a long time, you may benefit from receiving support for quitting from a local support group or counselor. Ask your health care provider for a referral to these resources in your area. Where to find more information:  Centers for Disease Control and Prevention (CDC): [employer] Get help right away if: Seek medical care right away if you are taking opioids and you (or people close to you) notice any of the following:  Difficulty breathing.  Breathing that is slower or more shallow than normal.  A very slow heartbeat (pulse).  Severe confusion.  Unconsciousness.  Sleepiness.  Slurred speech.  Nausea and vomiting.  Cold, clammy skin.  Blue lips or fingernails.  Limpness.  Abnormally small pupils.  If you think that you or someone else may have taken too much of an opioid medicine, get medical help right away. Do not wait to see if the symptoms go away on their own.  If you ever feel like you may hurt yourself or others, or have thoughts about taking your own life, get help right away. You can go to your nearest emergency department or call:  Your local  emergency services (911 in the [place]  The hotline of the [facility] 5860059238 in the [place]  A suicide crisis helpline, such as the [place] at (360) 860-1215. This is open 24 hours a day.  Summary  Opioid medicines can help you manage moderate to severe pain for a short period of time.  Discuss the goals of your treatment with your health care provider, including how much pain you might expect to have and how you will manage the pain.  A good treatment plan uses more than one approach. Pain can be managed with many types of alternative treatments.  If you think that you or someone else may have taken too much of an opioid, get medical help right away. This information is not intended to replace advice given to you by your health care provider. Make sure you discuss any questions you have with your health care provider. Document Released: 09/29/2015 Document Revised: 12/20/2016 Document Reviewed: 04/14/2015 Elsevier Interactive Patient Education  2018 [place] AND NASAL SEPTAL FRACTURE  Ice pack  x 24 hrs Sleep with head elevated 3-4 nights Rinse throat with cool dilute salt water as often as desired Diet as tolerated Recheck my office 24 Dec for nasal packing removal.  Take a dose of pain medication before this visit.  [phone] for an appointment Keep external splint dry Change drip pad as needed.
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
## 2018-08-29 ENCOUNTER — Encounter (HOSPITAL_COMMUNITY): Payer: Self-pay

## 2018-08-29 LAB — COMPREHENSIVE METABOLIC PANEL
ALT: 16 U/L (ref 0–44)
AST: 46 U/L — ABNORMAL HIGH (ref 15–41)
Albumin: 2.8 g/dL — ABNORMAL LOW (ref 3.5–5.0)
Alkaline Phosphatase: 43 U/L (ref 38–126)
Anion gap: 8 (ref 5–15)
BUN: 16 mg/dL (ref 6–20)
CO2: 29 mmol/L (ref 22–32)
Calcium: 8.1 mg/dL — ABNORMAL LOW (ref 8.9–10.3)
Chloride: 100 mmol/L (ref 98–111)
Creatinine, Ser: 0.88 mg/dL (ref 0.61–1.24)
GFR calc Af Amer: >60 mL/min (ref >60)
GFR calc non Af Amer: >60 mL/min (ref >60)
Glucose, Bld: 110 mg/dL — ABNORMAL HIGH (ref 70–99)
POTASSIUM: 3.9 mmol/L (ref 3.5–5.1)
Sodium: 137 mmol/L (ref 135–145)
Total Bilirubin: 0.6 mg/dL (ref 0.3–1.2)
Total Protein: 5.5 g/dL — ABNORMAL LOW (ref 6.5–8.1)

## 2018-08-29 LAB — BASIC METABOLIC PANEL
Anion gap: 6 (ref 5–15)
BUN: 15 mg/dL (ref 6–20)
CO2: 27 mmol/L (ref 22–32)
Calcium: 8.2 mg/dL — ABNORMAL LOW (ref 8.9–10.3)
Chloride: 105 mmol/L (ref 98–111)
Creatinine, Ser: 0.83 mg/dL (ref 0.61–1.24)
GFR calc Af Amer: >60 mL/min (ref >60)
GFR calc non Af Amer: >60 mL/min (ref >60)
Glucose, Bld: 97 mg/dL (ref 70–99)
Potassium: 4.4 mmol/L (ref 3.5–5.1)
Sodium: 138 mmol/L (ref 135–145)

## 2018-08-29 LAB — CBC
HCT: 25 % — ABNORMAL LOW (ref 39.0–52.0)
Hemoglobin: 7.7 g/dL — ABNORMAL LOW (ref 13.0–17.0)
MCH: 30.3 pg (ref 26.0–34.0)
MCHC: 30.8 g/dL (ref 30.0–36.0)
MCV: 98.4 fL (ref 80.0–100.0)
Platelets: 204 10*3/uL (ref 150–400)
RBC: 2.54 MIL/uL — ABNORMAL LOW (ref 4.22–5.81)
RDW: 13.2 % (ref 11.5–15.5)
WBC: 8.2 10*3/uL (ref 4.0–10.5)
nRBC: 0 % (ref 0.0–0.2)

## 2018-08-29 LAB — PREPARE RBC (CROSSMATCH)

## 2018-08-29 LAB — ABO/RH: ABO/RH(D): A POS

## 2018-08-29 MED ORDER — SODIUM CHLORIDE 0.9% IV SOLUTION: Freq: Once | INTRAVENOUS | Status: DC
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
## 2018-08-29 NOTE — Plan of Care (Signed)
  Problem: Clinical Measurements: Goal: Will remain free from infection Outcome: Progressing   Problem: Clinical Measurements: Goal: Diagnostic test results will improve Outcome: Progressing   Problem: Activity: Goal: Risk for activity intolerance will decrease Outcome: Progressing   Problem: Coping: Goal: Level of anxiety will decrease Outcome: Progressing   Problem: Pain Managment: Goal: General experience of comfort will improve Outcome: Progressing   Problem: Safety: Goal: Ability to remain free from injury will improve Outcome: Progressing   Problem: Skin Integrity: Goal: Risk for impaired skin integrity will decrease Outcome: Progressing

## 2018-08-29 NOTE — Plan of Care (Signed)
Pt is progressing toward all goals on care plan.

## 2018-08-30 LAB — TYPE AND SCREEN
ABO/RH(D): A POS
Antibody Screen: NEGATIVE
Unit division: 0

## 2018-08-30 LAB — BPAM RBC
Blood Product Expiration Date: 202001022359
ISSUE DATE / TIME: 201912141330
UNIT TYPE AND RH: 6200

## 2018-08-30 LAB — CBC
HCT: 27.7 % — ABNORMAL LOW (ref 39.0–52.0)
Hemoglobin: 8.8 g/dL — ABNORMAL LOW (ref 13.0–17.0)
MCH: 30.2 pg (ref 26.0–34.0)
MCHC: 31.8 g/dL (ref 30.0–36.0)
MCV: 95.2 fL (ref 80.0–100.0)
Platelets: 217 10*3/uL (ref 150–400)
RBC: 2.91 MIL/uL — AB (ref 4.22–5.81)
RDW: 13.9 % (ref 11.5–15.5)
WBC: 7.1 10*3/uL (ref 4.0–10.5)
nRBC: 0 % (ref 0.0–0.2)

## 2018-08-30 NOTE — Plan of Care (Signed)
  Problem: Education: Goal: Knowledge of General Education information will improve Description Including pain rating scale, medication(s)/side effects and non-pharmacologic comfort measures Outcome: Progressing   Problem: Health Behavior/Discharge Planning: Goal: Ability to manage health-related needs will improve Outcome: Progressing   Problem: Clinical Measurements: Goal: Ability to maintain clinical measurements within normal limits will improve Outcome: Progressing Goal: Will remain free from infection Outcome: Progressing Goal: Diagnostic test results will improve Outcome: Progressing Goal: Respiratory complications will improve Outcome: Progressing Goal: Cardiovascular complication will be avoided Outcome: Progressing   Problem: Activity: Goal: Risk for activity intolerance will decrease Outcome: Progressing Note:  Pt ambulated in the hallway with staff assistance.

## 2018-08-30 NOTE — Progress Notes (Addendum)
Patient ID: ADE STMARIE, male   DOB: 1966-01-17, 52 y.o.   MRN: [id number]     Subjective:  Patient reports pain as mild to moderate.  Patient continues to request pain medication more frequent than prescribed   Objective:   VITALS:   Vitals:   08/29/18 1354 08/29/18 1622 08/29/18 1940 08/30/18 0428  BP: 97/62 115/71 117/72 106/70  Pulse: 77 74 81 67  Resp: 16 20 15 19   Temp: 98.6 F (37 C) 98.5 F (36.9 C) 98.3 F (36.8 C) 98.4 F (36.9 C)  TempSrc: Oral Oral  Oral  SpO2:  100% 100% 99%  Weight:      Height:        ABD soft Sensation intact distally Dorsiflexion/Plantar flexion intact Incision: dressing C/D/I and no drainage   Lab Results  Component Value Date   WBC 7.1 08/30/2018   HGB 8.8 (L) 08/30/2018   HCT 27.7 (L) 08/30/2018   MCV 95.2 08/30/2018   PLT 217 08/30/2018   BMET    Component Value Date/Time   NA 137 08/29/2018 2244   K 3.9 08/29/2018 2244   CL 100 08/29/2018 2244   CO2 29 08/29/2018 2244   GLUCOSE 110 (H) 08/29/2018 2244   BUN 16 08/29/2018 2244   CREATININE 0.88 08/29/2018 2244   CALCIUM 8.1 (L) 08/29/2018 2244   GFRNONAA >60 08/29/2018 2244   GFRAA >60 08/29/2018 2244     Assessment/Plan: 3 Days Post-Op   Active Problems:   Femur fracture, left (HCC)   Advance diet Up with therapy Continue plan per trauma WBAT Dry dressing PRN Continue pain regimen    Lunette Stands 08/30/2018, 11:56 AM  Reviewed, discussed, and agree with above.  Marchia Bond, MD Cell [phone]
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
## 2018-08-31 MED ORDER — SALINE SPRAY 0.65 % NA SOLN
2.0000 | NASAL | Status: DC | PRN
  Filled 2018-08-31: qty 44

## 2018-08-31 MED ORDER — POLYETHYLENE GLYCOL 3350 17 G PO PACK
17.0000 g | PACK | Freq: Every day | ORAL | Status: DC
  Administered 2018-09-01 – 2018-09-05 (×2): 17 g via ORAL
  Filled 2018-08-31 (×4): qty 1

## 2018-08-31 MED ORDER — DOCUSATE SODIUM 100 MG PO CAPS
100.0000 mg | ORAL_CAPSULE | Freq: Every day | ORAL | Status: DC
  Administered 2018-09-01 – 2018-09-05 (×4): 100 mg via ORAL
  Filled 2018-08-31 (×4): qty 1
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
## 2018-08-31 NOTE — Progress Notes (Signed)
08/31/2018 9:33 AM  Christopher Meyers [id number]  hosp Day 5    Temp:  [98.1 F (36.7 C)-98.6 F (37 C)] 98.3 F (36.8 C) (12/16 0508) Pulse Rate:  [68-79] 79 (12/16 0902) Resp:  [12-16] 16 (12/16 0508) BP: (92-113)/(60-73) 110/71 (12/16 0902) SpO2:  [100 %] 100 % (12/16 0508),     Intake/Output Summary (Last 24 hours) at 08/31/2018 0933 Last data filed at 08/30/2018 2229 Gross per 24 hour  Intake 240 ml  Output 400 ml  Net -160 ml    No results found for this or any previous visit (from the past 24 hour(s)).  SUBJECTIVE:  Pt c/o poor vision OS.  Also RIGHT ear pain. Pain along LEFT jaw.   Notes poor breathing LEFT nose  OBJECTIVE:  EOMI without diplopia OU.  RIGHT TMJ tenderness.  Ears clear on both sides.  Swelling along LEFT mandible but no crepitus or step off.  No teeth in that vicinity.  No trismus.  ROM mandible good.  Internal nose dry on both sides.  Narrowing of left nasal airway.  I reviewed his maxillofacial CT scans.  He has a nasal septal fx and fx lateral nasal wall, LEFT.  Airway impinged on LEFT.    IMPRESSION:  LEFT nasal airway narrowing.  Vision difficulty OS.  No mandible fx.  Probable RIGHT TMJ discomfort.    PLAN:  Ophth consult.  Nasal hygiene measures.  Would plan closed vs open reduction LEFT maxillary/nasal and nasal septal fx's Friday, Monday at the latest.  Discussed with pt.    [name] [facility]

## 2018-08-31 NOTE — Progress Notes (Signed)
Came in at 3pm other meds not given
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
## 2018-08-31 NOTE — Progress Notes (Signed)
Spoke with medical assistant at Dr. Alanda Slim, ophthamologist, office for a consult. She stated he would call me with questions.   Jackson Latino, [facility] Surgery Pager [phone]
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
## 2018-09-01 LAB — CBC
HCT: 33.7 % — ABNORMAL LOW (ref 39.0–52.0)
Hemoglobin: 10.7 g/dL — ABNORMAL LOW (ref 13.0–17.0)
MCH: 30.5 pg (ref 26.0–34.0)
MCHC: 31.8 g/dL (ref 30.0–36.0)
MCV: 96 fL (ref 80.0–100.0)
Platelets: 331 10*3/uL (ref 150–400)
RBC: 3.51 MIL/uL — ABNORMAL LOW (ref 4.22–5.81)
RDW: 13.4 % (ref 11.5–15.5)
WBC: 7.4 10*3/uL (ref 4.0–10.5)
nRBC: 0 % (ref 0.0–0.2)

## 2018-09-01 LAB — BASIC METABOLIC PANEL
Anion gap: 10 (ref 5–15)
BUN: 20 mg/dL (ref 6–20)
CALCIUM: 9 mg/dL (ref 8.9–10.3)
CO2: 29 mmol/L (ref 22–32)
CREATININE: 0.91 mg/dL (ref 0.61–1.24)
Chloride: 96 mmol/L — ABNORMAL LOW (ref 98–111)
GFR calc Af Amer: >60 mL/min (ref >60)
GFR calc non Af Amer: >60 mL/min (ref >60)
Glucose, Bld: 98 mg/dL (ref 70–99)
Potassium: 4.2 mmol/L (ref 3.5–5.1)
Sodium: 135 mmol/L (ref 135–145)
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
## 2018-09-02 ENCOUNTER — Encounter (HOSPITAL_COMMUNITY): Payer: Self-pay | Admitting: General Practice

## 2018-09-02 LAB — BASIC METABOLIC PANEL
ANION GAP: 8 (ref 5–15)
BUN: 19 mg/dL (ref 6–20)
CO2: 30 mmol/L (ref 22–32)
Calcium: 9.1 mg/dL (ref 8.9–10.3)
Chloride: 98 mmol/L (ref 98–111)
Creatinine, Ser: 0.79 mg/dL (ref 0.61–1.24)
GFR calc Af Amer: >60 mL/min (ref >60)
GFR calc non Af Amer: >60 mL/min (ref >60)
Glucose, Bld: 109 mg/dL — ABNORMAL HIGH (ref 70–99)
Potassium: 4.7 mmol/L (ref 3.5–5.1)
SODIUM: 136 mmol/L (ref 135–145)

## 2018-09-02 LAB — CBC
HCT: 33.5 % — ABNORMAL LOW (ref 39.0–52.0)
Hemoglobin: 10.7 g/dL — ABNORMAL LOW (ref 13.0–17.0)
MCH: 30.7 pg (ref 26.0–34.0)
MCHC: 31.9 g/dL (ref 30.0–36.0)
MCV: 96.3 fL (ref 80.0–100.0)
Platelets: 338 10*3/uL (ref 150–400)
RBC: 3.48 MIL/uL — ABNORMAL LOW (ref 4.22–5.81)
RDW: 13.4 % (ref 11.5–15.5)
WBC: 11.1 10*3/uL — ABNORMAL HIGH (ref 4.0–10.5)
nRBC: 0 % (ref 0.0–0.2)

## 2018-09-02 MED ORDER — METHOCARBAMOL 500 MG PO TABS
500.0000 mg | ORAL_TABLET | Freq: Three times a day (TID) | ORAL | Status: DC | PRN
  Administered 2018-09-02 – 2018-09-05 (×5): 500 mg via ORAL
  Filled 2018-09-02 (×5): qty 1

## 2018-09-02 MED ORDER — HYDROMORPHONE HCL 1 MG/ML IJ SOLN
1.0000 mg | INTRAMUSCULAR | Status: DC | PRN
  Administered 2018-09-03: 2 mg via INTRAVENOUS
  Administered 2018-09-04: 1 mg via INTRAVENOUS
  Filled 2018-09-02 (×3): qty 2

## 2018-09-02 NOTE — Progress Notes (Signed)
Spoke with Vinnie Level, Publishing copy at [facility] in [place]:  She states pt IS able to have home health PT short term while under hospice services, since it is not related to his hospice diagnosis.  Will need order for HHPT faxed to hospice agency sister [facility] agency, [facility], at [phone].  Will fax when available.  Will notify Hospice agency upon dc date.    Reinaldo Raddle, RN, BSN  Trauma/Neuro ICU Case Manager [phone]
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
## 2018-09-02 NOTE — Plan of Care (Signed)
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
## 2018-09-03 LAB — CBC
HCT: 29.4 % — ABNORMAL LOW (ref 39.0–52.0)
Hemoglobin: 9.6 g/dL — ABNORMAL LOW (ref 13.0–17.0)
MCH: 31.3 pg (ref 26.0–34.0)
MCHC: 32.7 g/dL (ref 30.0–36.0)
MCV: 95.8 fL (ref 80.0–100.0)
Platelets: 346 10*3/uL (ref 150–400)
RBC: 3.07 MIL/uL — ABNORMAL LOW (ref 4.22–5.81)
RDW: 13.5 % (ref 11.5–15.5)
WBC: 7.2 10*3/uL (ref 4.0–10.5)
nRBC: 0 % (ref 0.0–0.2)

## 2018-09-03 LAB — BASIC METABOLIC PANEL
Anion gap: 8 (ref 5–15)
BUN: 22 mg/dL — ABNORMAL HIGH (ref 6–20)
CO2: 29 mmol/L (ref 22–32)
Calcium: 8.6 mg/dL — ABNORMAL LOW (ref 8.9–10.3)
Chloride: 99 mmol/L (ref 98–111)
Creatinine, Ser: 0.77 mg/dL (ref 0.61–1.24)
GFR calc Af Amer: >60 mL/min (ref >60)
GFR calc non Af Amer: >60 mL/min (ref >60)
GLUCOSE: 122 mg/dL — AB (ref 70–99)
Potassium: 3.9 mmol/L (ref 3.5–5.1)
Sodium: 136 mmol/L (ref 135–145)

## 2018-09-03 NOTE — Plan of Care (Signed)
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
## 2018-09-04 ENCOUNTER — Encounter (HOSPITAL_COMMUNITY): Payer: Self-pay

## 2018-09-04 ENCOUNTER — Inpatient Hospital Stay (HOSPITAL_COMMUNITY): Payer: Medicaid Other | Admitting: Anesthesiology

## 2018-09-04 ENCOUNTER — Encounter (HOSPITAL_COMMUNITY): Admission: EM | Disposition: A | Discharge status: Home / Self Care | Payer: Self-pay | Source: Home / Self Care

## 2018-09-04 HISTORY — PX: CLOSED REDUCTION NASAL FRACTURE: SHX5365

## 2018-09-04 LAB — CBC
HEMATOCRIT: 30.5 % — AB (ref 39.0–52.0)
Hemoglobin: 9.9 g/dL — ABNORMAL LOW (ref 13.0–17.0)
MCH: 31.7 pg (ref 26.0–34.0)
MCHC: 32.5 g/dL (ref 30.0–36.0)
MCV: 97.8 fL (ref 80.0–100.0)
Platelets: 378 10*3/uL (ref 150–400)
RBC: 3.12 MIL/uL — ABNORMAL LOW (ref 4.22–5.81)
RDW: 13.8 % (ref 11.5–15.5)
WBC: 7.4 10*3/uL (ref 4.0–10.5)
nRBC: 0 % (ref 0.0–0.2)

## 2018-09-04 SURGERY — CLOSED REDUCTION, FRACTURE, NASAL BONE
Anesthesia: General | Site: Nose

## 2018-09-04 MED ORDER — OXYCODONE HCL 5 MG PO TABS: 5.0000 mg | ORAL_TABLET | Freq: Once | ORAL | Status: DC | PRN

## 2018-09-04 MED ORDER — CHLORHEXIDINE GLUCONATE CLOTH 2 % EX PADS
6.0000 | MEDICATED_PAD | Freq: Once | CUTANEOUS | Status: AC
  Administered 2018-09-04: 6 via TOPICAL

## 2018-09-04 MED ORDER — CEFAZOLIN SODIUM-DEXTROSE 2-4 GM/100ML-% IV SOLN
2.0000 g | INTRAVENOUS | Status: AC
  Administered 2018-09-04: 2 g via INTRAVENOUS

## 2018-09-04 MED ORDER — CEPHALEXIN 250 MG/5ML PO SUSR
500.0000 mg | Freq: Four times a day (QID) | ORAL | Status: DC
  Administered 2018-09-04 – 2018-09-05 (×3): 500 mg via ORAL
  Filled 2018-09-04 (×5): qty 10

## 2018-09-04 MED ORDER — FENTANYL CITRATE (PF) 100 MCG/2ML IJ SOLN
INTRAMUSCULAR | Status: DC | PRN
  Administered 2018-09-04 (×3): 50 ug via INTRAVENOUS

## 2018-09-04 MED ORDER — MIDAZOLAM HCL 5 MG/5ML IJ SOLN
INTRAMUSCULAR | Status: DC | PRN
  Administered 2018-09-04: 2 mg via INTRAVENOUS

## 2018-09-04 MED ORDER — NICOTINE 7 MG/24HR TD PT24
7.0000 mg | MEDICATED_PATCH | Freq: Every day | TRANSDERMAL | Status: DC
  Administered 2018-09-04 – 2018-09-05 (×2): 7 mg via TRANSDERMAL
  Filled 2018-09-04 (×2): qty 1

## 2018-09-04 MED ORDER — LACTATED RINGERS IV SOLN: INTRAVENOUS | Status: DC

## 2018-09-04 MED ORDER — CIPROFLOXACIN-DEXAMETHASONE 0.3-0.1 % OT SUSP
OTIC | Status: AC
  Filled 2018-09-04: qty 7.5

## 2018-09-04 MED ORDER — SODIUM CHLORIDE 0.9 % IV SOLN
INTRAVENOUS | Status: DC
  Filled 2018-09-04 (×2): qty 1000

## 2018-09-04 MED ORDER — OXYCODONE HCL 5 MG/5ML PO SOLN: 5.0000 mg | Freq: Once | ORAL | Status: DC | PRN

## 2018-09-04 MED ORDER — PROPOFOL 10 MG/ML IV BOLUS
INTRAVENOUS | Status: AC
  Filled 2018-09-04: qty 20

## 2018-09-04 MED ORDER — PROPOFOL 10 MG/ML IV BOLUS
INTRAVENOUS | Status: DC | PRN
  Administered 2018-09-04: 50 mg via INTRAVENOUS

## 2018-09-04 MED ORDER — ONDANSETRON HCL 4 MG PO TABS: 4.0000 mg | ORAL_TABLET | ORAL | Status: DC | PRN

## 2018-09-04 MED ORDER — NEOMYCIN-POLYMYXIN-HC OP SUSP
OPHTHALMIC | Status: DC | PRN
  Administered 2018-09-04: 7.5 mL

## 2018-09-04 MED ORDER — ONDANSETRON HCL 4 MG/2ML IJ SOLN: 4.0000 mg | Freq: Once | INTRAMUSCULAR | Status: DC | PRN

## 2018-09-04 MED ORDER — LIDOCAINE 2% (20 MG/ML) 5 ML SYRINGE
INTRAMUSCULAR | Status: DC | PRN
  Administered 2018-09-04: 60 mg via INTRAVENOUS

## 2018-09-04 MED ORDER — FENTANYL CITRATE (PF) 100 MCG/2ML IJ SOLN: 25.0000 ug | INTRAMUSCULAR | Status: DC | PRN

## 2018-09-04 MED ORDER — OXYMETAZOLINE HCL 0.05 % NA SOLN
NASAL | Status: AC
  Administered 2018-09-04: 2 via NASAL
  Filled 2018-09-04: qty 15

## 2018-09-04 MED ORDER — CEFAZOLIN SODIUM-DEXTROSE 2-4 GM/100ML-% IV SOLN
INTRAVENOUS | Status: AC
  Filled 2018-09-04: qty 100

## 2018-09-04 MED ORDER — OXYMETAZOLINE HCL 0.05 % NA SOLN
2.0000 | NASAL | Status: DC | PRN
  Administered 2018-09-04: 2 via NASAL

## 2018-09-04 MED ORDER — FENTANYL CITRATE (PF) 250 MCG/5ML IJ SOLN
INTRAMUSCULAR | Status: AC
  Filled 2018-09-04: qty 5

## 2018-09-04 MED ORDER — ONDANSETRON HCL 4 MG/2ML IJ SOLN: 4.0000 mg | INTRAMUSCULAR | Status: DC | PRN

## 2018-09-04 MED ORDER — PHENYLEPHRINE HCL 10 MG/ML IJ SOLN
INTRAMUSCULAR | Status: DC | PRN
  Administered 2018-09-04: 80 mg via INTRAVENOUS
  Administered 2018-09-04: 120 mg via INTRAVENOUS

## 2018-09-04 MED ORDER — LIDOCAINE-EPINEPHRINE 1 %-1:100000 IJ SOLN
INTRAMUSCULAR | Status: AC
  Filled 2018-09-04: qty 1

## 2018-09-04 MED ORDER — OXYMETAZOLINE HCL 0.05 % NA SOLN
NASAL | Status: AC
  Filled 2018-09-04: qty 15

## 2018-09-04 MED ORDER — MIDAZOLAM HCL 2 MG/2ML IJ SOLN
INTRAMUSCULAR | Status: AC
  Filled 2018-09-04: qty 2

## 2018-09-04 MED ORDER — PHENOL 1.4 % MT LIQD: 1.0000 | OROMUCOSAL | Status: DC | PRN

## 2018-09-04 MED ORDER — IBUPROFEN 100 MG/5ML PO SUSP
400.0000 mg | Freq: Four times a day (QID) | ORAL | Status: DC | PRN
  Filled 2018-09-04: qty 20

## 2018-09-04 MED ORDER — DEXTROSE-NACL 5-0.45 % IV SOLN
INTRAVENOUS | Status: DC
  Administered 2018-09-04 – 2018-09-05 (×2): via INTRAVENOUS

## 2018-09-04 SURGICAL SUPPLY — 30 items
BLADE SURG 15 STRL LF DISP TIS (BLADE) IMPLANT
BLADE SURG 15 STRL SS (BLADE)
CANISTER SUCT 3000ML PPV (MISCELLANEOUS) ×3 IMPLANT
COVER BACK TABLE 60X90IN (DRAPES) ×3 IMPLANT
COVER MAYO STAND STRL (DRAPES) ×3 IMPLANT
COVER WAND RF STERILE (DRAPES) ×3 IMPLANT
CRADLE DONUT ADULT HEAD (MISCELLANEOUS) IMPLANT
DRAPE HALF SHEET 40X57 (DRAPES) IMPLANT
DRESSING NASAL KENNEDY 3.5X.9 (MISCELLANEOUS) ×2 IMPLANT
DRESSING NASAL POPE 10X1.5X2.5 (GAUZE/BANDAGES/DRESSINGS) ×4 IMPLANT
DRSG NASAL KENNEDY 3.5X.9 (MISCELLANEOUS) ×6
DRSG NASAL POPE 10X1.5X2.5 (GAUZE/BANDAGES/DRESSINGS) ×12
GAUZE 4X4 16PLY RFD (DISPOSABLE) ×3 IMPLANT
GAUZE SPONGE 2X2 8PLY STRL LF (GAUZE/BANDAGES/DRESSINGS) ×1 IMPLANT
GLOVE ECLIPSE 8.0 STRL XLNG CF (GLOVE) IMPLANT
GOWN STRL REUS W/ TWL LRG LVL3 (GOWN DISPOSABLE) ×1 IMPLANT
GOWN STRL REUS W/TWL LRG LVL3 (GOWN DISPOSABLE) ×2
KIT BASIN OR (CUSTOM PROCEDURE TRAY) ×3 IMPLANT
KIT SPLINT NASAL DENVER SM BEI (GAUZE/BANDAGES/DRESSINGS) ×3 IMPLANT
KIT TURNOVER KIT B (KITS) ×3 IMPLANT
NEEDLE HYPO 25GX1X1/2 BEV (NEEDLE) ×3 IMPLANT
NS IRRIG 1000ML POUR BTL (IV SOLUTION) ×3 IMPLANT
PAD ARMBOARD 7.5X6 YLW CONV (MISCELLANEOUS) ×6 IMPLANT
PATTIES SURGICAL .5 X3 (DISPOSABLE) IMPLANT
SPONGE GAUZE 2X2 STER 10/PKG (GAUZE/BANDAGES/DRESSINGS) ×2
SYR CONTROL 10ML LL (SYRINGE) ×3 IMPLANT
TOWEL OR 17X24 6PK STRL BLUE (TOWEL DISPOSABLE) ×6 IMPLANT
TUBE CONNECTING 12'X1/4 (SUCTIONS) ×1
TUBE CONNECTING 12X1/4 (SUCTIONS) ×2 IMPLANT
WATER STERILE IRR 1000ML POUR (IV SOLUTION) ×3 IMPLANT

## 2018-09-04 NOTE — Progress Notes (Signed)
Report was given to Sam, RN in Short Stay
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
## 2018-09-04 NOTE — Transfer of Care (Signed)
Immediate Anesthesia Transfer of Care Note  Patient: Christopher Meyers  Procedure(s) Performed: CLOSED REDUCTION NASAL FRACTURE (N/A Nose)  Patient Location: PACU  Anesthesia Type:General  Level of Consciousness: awake, alert , oriented and patient cooperative  Airway & Oxygen Therapy: Patient Spontanous Breathing  Post-op Assessment: Report given to RN and Post -op Vital signs reviewed and stable  Post vital signs: Reviewed and stable  Last Vitals:  Vitals Value Taken Time  BP 117/69 09/04/2018  5:31 PM  Temp 36.5 C 09/04/2018  5:31 PM  Pulse 74 09/04/2018  5:35 PM  Resp 21 09/04/2018  5:35 PM  SpO2 100 % 09/04/2018  5:35 PM  Vitals shown include unvalidated device data.  Last Pain:  Vitals:   09/04/18 1731  TempSrc:   PainSc: 4       Patients Stated Pain Goal: 0 (36/64/40 3474)  Complications: No apparent anesthesia complications

## 2018-09-04 NOTE — Plan of Care (Signed)
  Problem: Clinical Measurements: Goal: Will remain free from infection Outcome: Progressing   Problem: Clinical Measurements: Goal: Respiratory complications will improve Outcome: Progressing   Problem: Activity: Goal: Risk for activity intolerance will decrease Outcome: Progressing   Problem: Safety: Goal: Ability to remain free from injury will improve Outcome: Progressing   Problem: Skin Integrity: Goal: Risk for impaired skin integrity will decrease Outcome: Progressing

## 2018-09-04 NOTE — Progress Notes (Signed)
The patient has been instructed numerous times that he is not allowed to walk off the unit.  Tele monitoring called- the patient has left the floor with his visitor.
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
## 2018-09-04 NOTE — Progress Notes (Signed)
09/04/2018 10:45 AM  Christopher Meyers [id number]  Hosp  Day 9    Temp:  [97.8 F (36.6 C)-98.2 F (36.8 C)] 98.2 F (36.8 C) (12/20 0439) Pulse Rate:  [65-72] 72 (12/20 0439) Resp:  [15-18] 18 (12/20 0439) BP: (77-109)/(49-71) 77/49 (12/20 0439) SpO2:  [98 %-100 %] 98 % (12/20 0439),     Intake/Output Summary (Last 24 hours) at 09/04/2018 1045 Last data filed at 09/03/2018 1700 Gross per 24 hour  Intake 480 ml  Output -  Net 480 ml    Results for orders placed or performed during the hospital encounter of 08/26/18 (from the past 24 hour(s))  CBC     Status: Abnormal   Collection Time: 09/04/18  3:41 AM  Result Value Ref Range   WBC 7.4 4.0 - 10.5 K/uL   RBC 3.12 (L) 4.22 - 5.81 MIL/uL   Hemoglobin 9.9 (L) 13.0 - 17.0 g/dL   HCT 30.5 (L) 39.0 - 52.0 %   MCV 97.8 80.0 - 100.0 fL   MCH 31.7 26.0 - 34.0 pg   MCHC 32.5 30.0 - 36.0 g/dL   RDW 13.8 11.5 - 15.5 %   Platelets 378 150 - 400 K/uL   nRBC 0.0 0.0 - 0.2 %    SUBJECTIVE:  Vision better.  Does not always wear upper plate.  OBJECTIVE:  No change in exam  IMPRESSION:  LEFT facial fx's  PLAN:  To OR today for closed vs open reduction LEFT nasal fx, LEFT maxillary fx, nasal septal fx.  Discussed with pt. Questions answered and informed consent obtained.  [name] [facility]
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
## 2018-09-05 MED ORDER — CEPHALEXIN 250 MG/5ML PO SUSR: 500.0000 mg | Freq: Four times a day (QID) | ORAL | 0 refills | Status: DC

## 2018-09-05 MED ORDER — CEPHALEXIN 250 MG/5ML PO SUSR: 500.0000 mg | Freq: Four times a day (QID) | ORAL | 0 refills | Status: AC

## 2018-09-05 MED ORDER — METHOCARBAMOL 500 MG PO TABS: 500.0000 mg | ORAL_TABLET | Freq: Three times a day (TID) | ORAL | 0 refills | Status: DC | PRN

## 2018-09-05 MED ORDER — NICOTINE 7 MG/24HR TD PT24: 7.0000 mg | MEDICATED_PATCH | Freq: Every day | TRANSDERMAL | 0 refills | Status: DC

## 2018-09-05 MED ORDER — DOCUSATE SODIUM 100 MG PO CAPS: 100.0000 mg | ORAL_CAPSULE | Freq: Every day | ORAL | 0 refills | Status: DC

## 2018-09-05 MED ORDER — POLYETHYLENE GLYCOL 3350 17 G PO PACK: 17.0000 g | PACK | Freq: Every day | ORAL | 0 refills | Status: DC

## 2018-09-05 MED ORDER — PHENOL 1.4 % MT LIQD: 1.0000 | OROMUCOSAL | 0 refills | Status: DC | PRN

## 2018-09-05 NOTE — Progress Notes (Signed)
09/05/2018 9:13 AM  Christopher Meyers [id number]  Post-Op Day 1    Temp:  [96.8 F (36 C)-97.8 F (36.6 C)] 97.5 F (36.4 C) (12/21 0753) Pulse Rate:  [55-79] 64 (12/21 0753) Resp:  [12-18] 16 (12/21 0753) BP: (105-144)/(61-89) 118/75 (12/21 0753) SpO2:  [99 %-100 %] 100 % (12/21 0753),     Intake/Output Summary (Last 24 hours) at 09/05/2018 0913 Last data filed at 09/05/2018 0807 Gross per 24 hour  Intake 2023.32 ml  Output 285 ml  Net 1738.32 ml    No results found for this or any previous visit (from the past 24 hour(s)).  SUBJECTIVE:  C/o cannot breath  OBJECTIVE:  Sleeping but arousable.  Breathing with mouth open. Nasal packing and external splint intact.   IMPRESSION:  Satisfactory check  PLAN:  Will remove internal packing on Tuesday.  External splint on 30 DEC.  OK for discharge home from my standpoint.  [name] [facility]

## 2018-09-05 NOTE — Progress Notes (Signed)
AVS given and reviewed with pt. Medications discussed. All questions answered to satisfaction.  Equipment at bedside. Pt escorted off the unit via wheelchair by staff member.

## 2018-09-07 ENCOUNTER — Encounter (HOSPITAL_COMMUNITY): Payer: Self-pay | Admitting: Otolaryngology

## 2018-09-07 ENCOUNTER — Encounter (HOSPITAL_COMMUNITY): Payer: Self-pay | Admitting: Emergency Medicine

## 2018-09-07 ENCOUNTER — Encounter: Payer: Self-pay | Admitting: Student

## 2018-09-07 DIAGNOSIS — S0292XA Unspecified fracture of facial bones, initial encounter for closed fracture: Secondary | ICD-10-CM | POA: Insufficient documentation

## 2018-09-10 NOTE — Anesthesia Postprocedure Evaluation (Signed)
Anesthesia Post Note  Patient: Christopher Meyers  Procedure(s) Performed: CLOSED REDUCTION NASAL FRACTURE (N/A Nose)     Patient location during evaluation: PACU Anesthesia Type: General Level of consciousness: awake and alert Pain management: pain level controlled Vital Signs Assessment: post-procedure vital signs reviewed and stable Respiratory status: spontaneous breathing, nonlabored ventilation and respiratory function stable Cardiovascular status: blood pressure returned to baseline and stable Postop Assessment: no apparent nausea or vomiting Anesthetic complications: no    Last Vitals:  Vitals:   09/05/18 0753 09/05/18 1227  BP: 118/75 97/76  Pulse: 64 67  Resp: 16 14  Temp: (!) 36.4 C 36.4 C  SpO2: 100% 100%    Last Pain:  Vitals:   09/05/18 1343  TempSrc:   PainSc: [place]

## 2018-10-24 DIAGNOSIS — Z9119 Patient's noncompliance with other medical treatment and regimen: Secondary | ICD-10-CM

## 2018-10-24 DIAGNOSIS — A419 Sepsis, unspecified organism: Secondary | ICD-10-CM

## 2018-10-24 DIAGNOSIS — I5022 Chronic systolic (congestive) heart failure: Secondary | ICD-10-CM

## 2018-10-24 DIAGNOSIS — F1910 Other psychoactive substance abuse, uncomplicated: Secondary | ICD-10-CM | POA: Diagnosis not present

## 2018-10-24 DIAGNOSIS — I2510 Atherosclerotic heart disease of native coronary artery without angina pectoris: Secondary | ICD-10-CM

## 2018-10-24 DIAGNOSIS — J449 Chronic obstructive pulmonary disease, unspecified: Secondary | ICD-10-CM

## 2018-10-24 DIAGNOSIS — I509 Heart failure, unspecified: Secondary | ICD-10-CM | POA: Diagnosis not present

## 2018-10-24 DIAGNOSIS — E039 Hypothyroidism, unspecified: Secondary | ICD-10-CM

## 2018-10-24 DIAGNOSIS — Z720 Tobacco use: Secondary | ICD-10-CM

## 2018-10-24 DIAGNOSIS — I428 Other cardiomyopathies: Secondary | ICD-10-CM

## 2018-10-25 DIAGNOSIS — F1910 Other psychoactive substance abuse, uncomplicated: Secondary | ICD-10-CM | POA: Diagnosis not present

## 2018-10-25 DIAGNOSIS — I509 Heart failure, unspecified: Secondary | ICD-10-CM | POA: Diagnosis not present

## 2018-10-25 DIAGNOSIS — I5022 Chronic systolic (congestive) heart failure: Secondary | ICD-10-CM | POA: Diagnosis not present

## 2018-10-25 DIAGNOSIS — I428 Other cardiomyopathies: Secondary | ICD-10-CM | POA: Diagnosis not present

## 2018-10-26 DIAGNOSIS — I428 Other cardiomyopathies: Secondary | ICD-10-CM | POA: Diagnosis not present

## 2018-10-26 DIAGNOSIS — I5022 Chronic systolic (congestive) heart failure: Secondary | ICD-10-CM | POA: Diagnosis not present

## 2018-10-26 DIAGNOSIS — F1910 Other psychoactive substance abuse, uncomplicated: Secondary | ICD-10-CM | POA: Diagnosis not present

## 2018-10-26 DIAGNOSIS — I509 Heart failure, unspecified: Secondary | ICD-10-CM | POA: Diagnosis not present

## 2019-01-10 IMAGING — CR DG CHEST 2V
2 series · 2 of 2 positions shown · non-contrast
Comparison: 03/25/2018

CLINICAL DATA: Chest pain, shortness of breath

EXAM:
CHEST - 2 VIEW

[w chest pa]
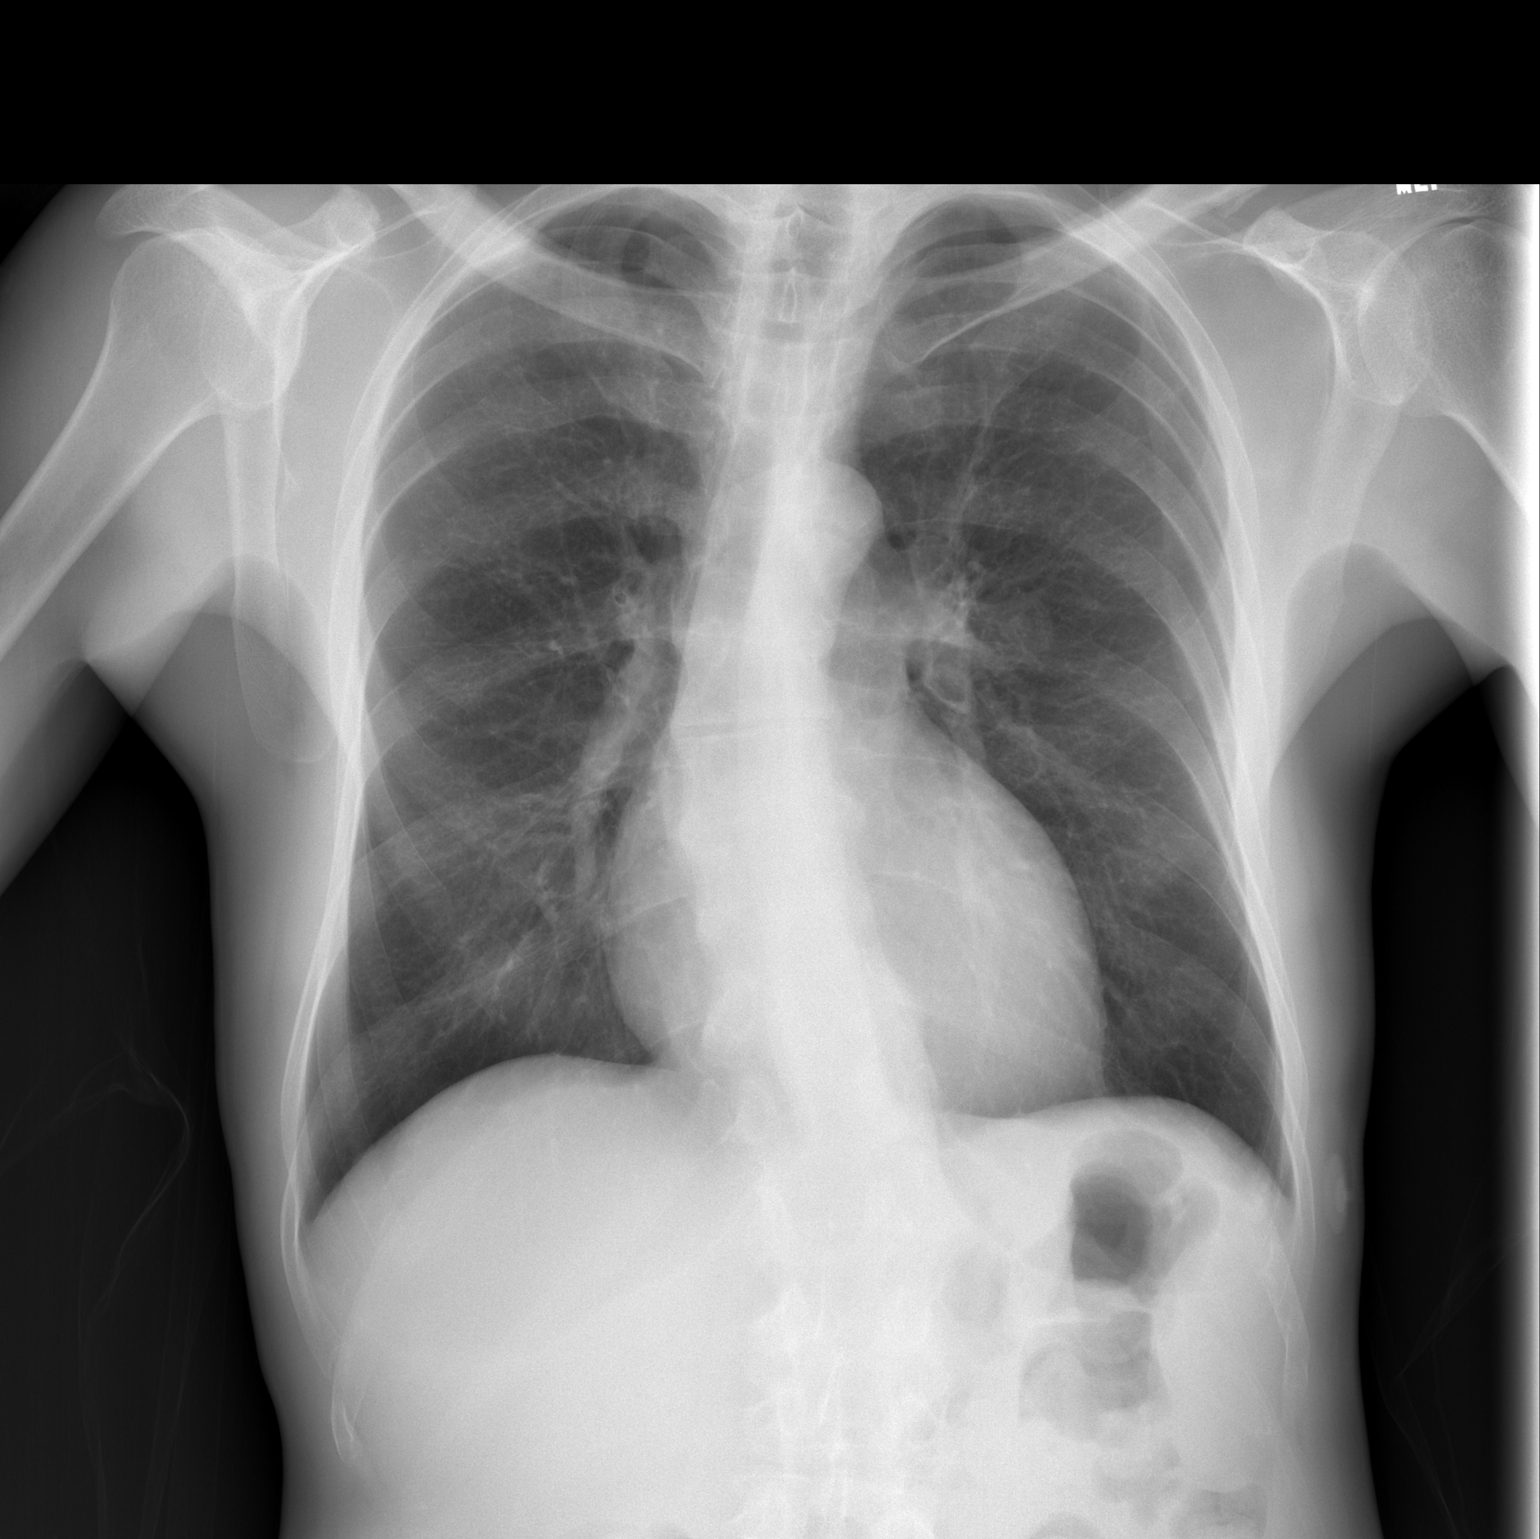

[w chest lat]
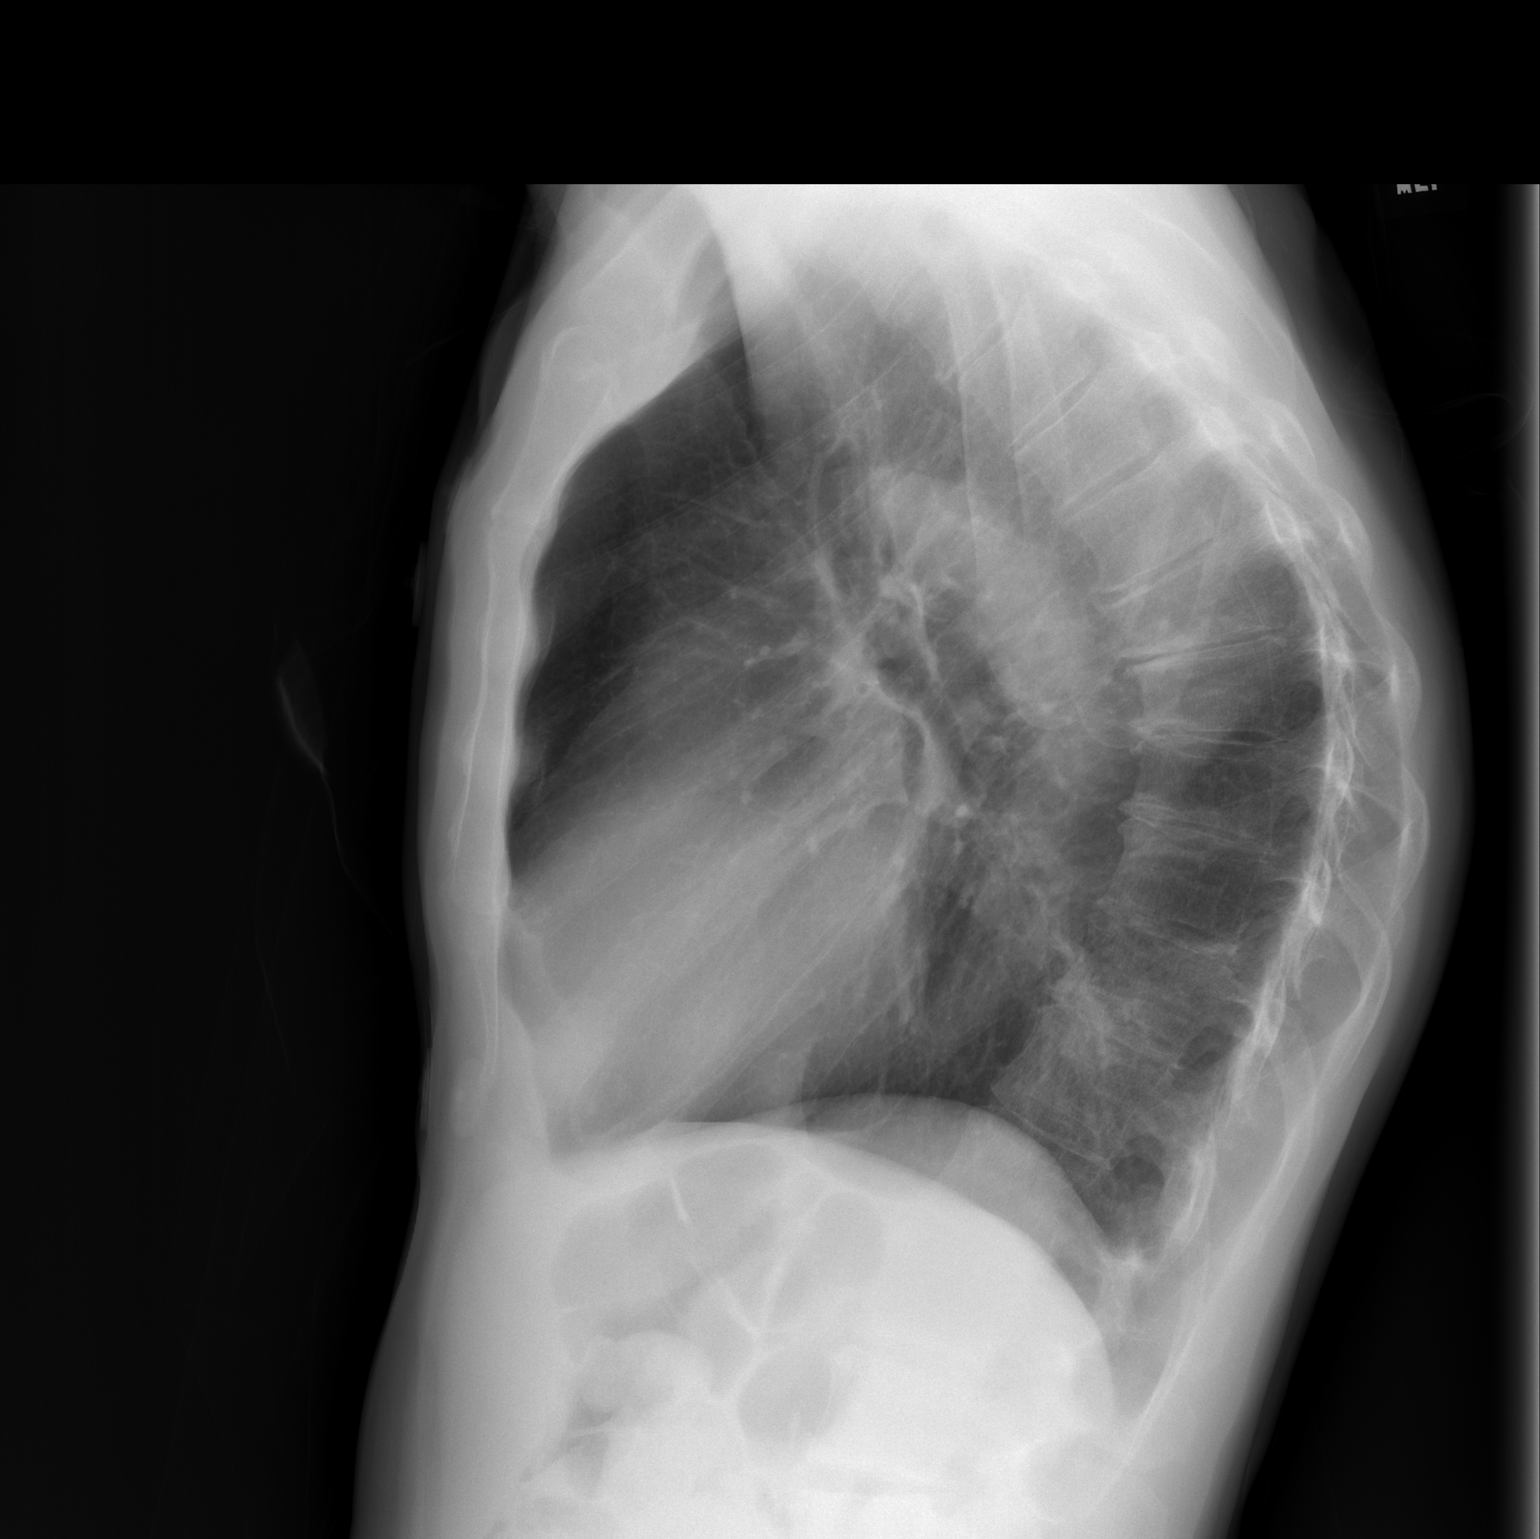

[2 of 2 positions shown; findings below may reference images not displayed]

FINDINGS: Heart and mediastinal contours are within normal limits. No focal
opacities or effusions. No acute bony abnormality. Mild S-shaped
thoracolumbar scoliosis.
IMPRESSION: No active cardiopulmonary disease.

## 2019-02-20 ENCOUNTER — Other Ambulatory Visit: Payer: Self-pay

## 2019-02-20 ENCOUNTER — Emergency Department (HOSPITAL_COMMUNITY)
Admission: EM | Admit: 2019-02-20 | Discharge: 2019-02-20 | Disposition: A | Discharge status: Home / Self Care | Payer: Medicaid Other | Attending: Emergency Medicine | Admitting: Emergency Medicine

## 2019-02-20 ENCOUNTER — Encounter (HOSPITAL_COMMUNITY): Payer: Self-pay

## 2019-02-20 ENCOUNTER — Emergency Department (HOSPITAL_COMMUNITY): Payer: Medicaid Other

## 2019-02-20 DIAGNOSIS — J449 Chronic obstructive pulmonary disease, unspecified: Secondary | ICD-10-CM | POA: Diagnosis not present

## 2019-02-20 DIAGNOSIS — R52 Pain, unspecified: Secondary | ICD-10-CM | POA: Diagnosis present

## 2019-02-20 DIAGNOSIS — Z8505 Personal history of malignant neoplasm of liver: Secondary | ICD-10-CM | POA: Diagnosis not present

## 2019-02-20 DIAGNOSIS — I2510 Atherosclerotic heart disease of native coronary artery without angina pectoris: Secondary | ICD-10-CM | POA: Insufficient documentation

## 2019-02-20 DIAGNOSIS — I110 Hypertensive heart disease with heart failure: Secondary | ICD-10-CM | POA: Diagnosis not present

## 2019-02-20 DIAGNOSIS — Z955 Presence of coronary angioplasty implant and graft: Secondary | ICD-10-CM | POA: Insufficient documentation

## 2019-02-20 DIAGNOSIS — Z79899 Other long term (current) drug therapy: Secondary | ICD-10-CM | POA: Diagnosis not present

## 2019-02-20 DIAGNOSIS — Z7982 Long term (current) use of aspirin: Secondary | ICD-10-CM | POA: Insufficient documentation

## 2019-02-20 DIAGNOSIS — Z760 Encounter for issue of repeat prescription: Secondary | ICD-10-CM | POA: Insufficient documentation

## 2019-02-20 DIAGNOSIS — G8929 Other chronic pain: Secondary | ICD-10-CM | POA: Insufficient documentation

## 2019-02-20 DIAGNOSIS — F17210 Nicotine dependence, cigarettes, uncomplicated: Secondary | ICD-10-CM | POA: Diagnosis not present

## 2019-02-20 DIAGNOSIS — I5022 Chronic systolic (congestive) heart failure: Secondary | ICD-10-CM | POA: Insufficient documentation

## 2019-02-20 HISTORY — DX: Malignant (primary) neoplasm, unspecified: C80.1

## 2019-02-20 LAB — BASIC METABOLIC PANEL
Anion gap: 10 (ref 5–15)
BUN: 15 mg/dL (ref 6–20)
CO2: 24 mmol/L (ref 22–32)
Calcium: 8.9 mg/dL (ref 8.9–10.3)
Chloride: 107 mmol/L (ref 98–111)
Creatinine, Ser: 0.78 mg/dL (ref 0.61–1.24)
GFR calc Af Amer: >60 mL/min (ref >60)
GFR calc non Af Amer: >60 mL/min (ref >60)
Glucose, Bld: 91 mg/dL (ref 70–99)
Potassium: 3.3 mmol/L — ABNORMAL LOW (ref 3.5–5.1)
Sodium: 141 mmol/L (ref 135–145)

## 2019-02-20 LAB — CBC WITH DIFFERENTIAL/PLATELET
Abs Immature Granulocytes: 0.01 10*3/uL (ref 0.00–0.07)
Basophils Absolute: 0 10*3/uL (ref 0.0–0.1)
Basophils Relative: 1 %
Eosinophils Absolute: 0.1 10*3/uL (ref 0.0–0.5)
Eosinophils Relative: 1 %
HCT: 43.2 % (ref 39.0–52.0)
Hemoglobin: 14.2 g/dL (ref 13.0–17.0)
Immature Granulocytes: 0 %
Lymphocytes Relative: 28 %
Lymphs Abs: 2.1 10*3/uL (ref 0.7–4.0)
MCH: 30.9 pg (ref 26.0–34.0)
MCHC: 32.9 g/dL (ref 30.0–36.0)
MCV: 94.1 fL (ref 80.0–100.0)
Monocytes Absolute: 0.6 10*3/uL (ref 0.1–1.0)
Monocytes Relative: 8 %
Neutro Abs: 4.6 10*3/uL (ref 1.7–7.7)
Neutrophils Relative %: 62 %
Platelets: 291 10*3/uL (ref 150–400)
RBC: 4.59 MIL/uL (ref 4.22–5.81)
RDW: 13.8 % (ref 11.5–15.5)
WBC: 7.4 10*3/uL (ref 4.0–10.5)
nRBC: 0 % (ref 0.0–0.2)

## 2019-02-20 LAB — TROPONIN I: Troponin I: <0.03 ng/mL (ref <0.03)

## 2019-02-20 MED ORDER — SODIUM CHLORIDE 0.9 % IV BOLUS
1000.0000 mL | Freq: Once | INTRAVENOUS | Status: AC
  Administered 2019-02-20: 1000 mL via INTRAVENOUS

## 2019-02-20 MED ORDER — IOHEXOL 350 MG/ML SOLN
75.0000 mL | Freq: Once | INTRAVENOUS | Status: AC | PRN
  Administered 2019-02-20: 75 mL via INTRAVENOUS

## 2019-02-20 NOTE — ED Notes (Signed)
Pt returned from ct  The ct tech reports that the existing iv on the lt forearm is not patent.  Removed on his arrival back to his room  c-t tech placed a rt a-c iv while in c-t

## 2019-02-20 NOTE — ED Notes (Signed)
Pt asleep.

## 2019-02-20 NOTE — Discharge Instructions (Addendum)
Follow up with your doctor.  Return here as needed 

## 2019-02-20 NOTE — ED Provider Notes (Signed)
Patient was signed out to me at shift change.  The patient was able to ambulate without difficulty and remained at 100% oxygenation on room air while ambulating.  In review of this patient's record I do not see any notes from any other facilities that indicate the patient is under hospice care.  Also spoke with case management who states that he cannot help [place] because they are not covering the ER.    Dalia Heading, PA-C 02/20/19 2002    Little, Wenda Overland, MD 02/20/19 2125

## 2019-02-20 NOTE — ED Notes (Signed)
Got patient undress on the monitor patient is resting with call bell in reach 
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
## 2019-02-20 NOTE — ED Notes (Signed)
Pt in xray
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
## 2019-03-10 ENCOUNTER — Other Ambulatory Visit: Payer: Self-pay

## 2019-03-10 ENCOUNTER — Emergency Department (HOSPITAL_COMMUNITY)
Admission: EM | Admit: 2019-03-10 | Discharge: 2019-03-10 | Disposition: A | Discharge status: Home / Self Care | Payer: Medicaid Other | Attending: Emergency Medicine | Admitting: Emergency Medicine

## 2019-03-10 ENCOUNTER — Encounter (HOSPITAL_COMMUNITY): Payer: Self-pay | Admitting: Emergency Medicine

## 2019-03-10 DIAGNOSIS — Z5321 Procedure and treatment not carried out due to patient leaving prior to being seen by health care provider: Secondary | ICD-10-CM | POA: Insufficient documentation

## 2019-03-10 DIAGNOSIS — H538 Other visual disturbances: Secondary | ICD-10-CM | POA: Insufficient documentation

## 2019-03-10 MED ORDER — SODIUM CHLORIDE 0.9% FLUSH: 3.0000 mL | Freq: Once | INTRAVENOUS | Status: DC

## 2019-03-10 NOTE — ED Triage Notes (Signed)
Patient here from home with complaints of sudden onset of blurred vision, dehydration, and weakness. Reports that he has been working out in the yard all day. States that he has been drinking water. Hx of liver cancer, states that he just came off of outpatient hospice. AAO x4, ambulatory with assistance.

## 2019-04-30 DIAGNOSIS — F1910 Other psychoactive substance abuse, uncomplicated: Secondary | ICD-10-CM

## 2019-04-30 DIAGNOSIS — F1510 Other stimulant abuse, uncomplicated: Secondary | ICD-10-CM | POA: Diagnosis not present

## 2019-04-30 DIAGNOSIS — J449 Chronic obstructive pulmonary disease, unspecified: Secondary | ICD-10-CM

## 2019-04-30 DIAGNOSIS — R0789 Other chest pain: Secondary | ICD-10-CM

## 2019-04-30 DIAGNOSIS — I509 Heart failure, unspecified: Secondary | ICD-10-CM

## 2019-05-01 DIAGNOSIS — I509 Heart failure, unspecified: Secondary | ICD-10-CM | POA: Diagnosis not present

## 2019-05-01 DIAGNOSIS — F1510 Other stimulant abuse, uncomplicated: Secondary | ICD-10-CM | POA: Diagnosis not present

## 2019-05-01 DIAGNOSIS — F1910 Other psychoactive substance abuse, uncomplicated: Secondary | ICD-10-CM | POA: Diagnosis not present

## 2019-05-01 DIAGNOSIS — R0789 Other chest pain: Secondary | ICD-10-CM | POA: Diagnosis not present

## 2019-05-02 DIAGNOSIS — R0789 Other chest pain: Secondary | ICD-10-CM | POA: Diagnosis not present

## 2019-05-02 DIAGNOSIS — F1910 Other psychoactive substance abuse, uncomplicated: Secondary | ICD-10-CM | POA: Diagnosis not present

## 2019-05-02 DIAGNOSIS — F1510 Other stimulant abuse, uncomplicated: Secondary | ICD-10-CM | POA: Diagnosis not present

## 2019-05-02 DIAGNOSIS — I509 Heart failure, unspecified: Secondary | ICD-10-CM | POA: Diagnosis not present

## 2019-06-23 DIAGNOSIS — I110 Hypertensive heart disease with heart failure: Secondary | ICD-10-CM | POA: Diagnosis not present

## 2019-06-23 DIAGNOSIS — Z9114 Patient's other noncompliance with medication regimen: Secondary | ICD-10-CM

## 2019-06-23 DIAGNOSIS — I959 Hypotension, unspecified: Secondary | ICD-10-CM

## 2019-06-23 DIAGNOSIS — R0600 Dyspnea, unspecified: Secondary | ICD-10-CM

## 2019-06-23 DIAGNOSIS — F1910 Other psychoactive substance abuse, uncomplicated: Secondary | ICD-10-CM

## 2019-06-23 DIAGNOSIS — E871 Hypo-osmolality and hyponatremia: Secondary | ICD-10-CM

## 2019-06-23 DIAGNOSIS — I5023 Acute on chronic systolic (congestive) heart failure: Secondary | ICD-10-CM | POA: Diagnosis not present

## 2019-06-23 DIAGNOSIS — Z590 Homelessness: Secondary | ICD-10-CM

## 2019-06-24 DIAGNOSIS — I110 Hypertensive heart disease with heart failure: Secondary | ICD-10-CM | POA: Diagnosis not present

## 2019-06-24 DIAGNOSIS — E871 Hypo-osmolality and hyponatremia: Secondary | ICD-10-CM | POA: Diagnosis not present

## 2019-06-24 DIAGNOSIS — I5023 Acute on chronic systolic (congestive) heart failure: Secondary | ICD-10-CM | POA: Diagnosis not present

## 2019-06-24 DIAGNOSIS — R0600 Dyspnea, unspecified: Secondary | ICD-10-CM | POA: Diagnosis not present

## 2019-06-25 DIAGNOSIS — I110 Hypertensive heart disease with heart failure: Secondary | ICD-10-CM | POA: Diagnosis not present

## 2019-06-25 DIAGNOSIS — E871 Hypo-osmolality and hyponatremia: Secondary | ICD-10-CM | POA: Diagnosis not present

## 2019-06-25 DIAGNOSIS — I5023 Acute on chronic systolic (congestive) heart failure: Secondary | ICD-10-CM | POA: Diagnosis not present

## 2019-06-25 DIAGNOSIS — R0600 Dyspnea, unspecified: Secondary | ICD-10-CM | POA: Diagnosis not present

## 2019-06-26 DIAGNOSIS — I110 Hypertensive heart disease with heart failure: Secondary | ICD-10-CM | POA: Diagnosis not present

## 2019-06-26 DIAGNOSIS — I5023 Acute on chronic systolic (congestive) heart failure: Secondary | ICD-10-CM

## 2019-06-26 DIAGNOSIS — R0600 Dyspnea, unspecified: Secondary | ICD-10-CM | POA: Diagnosis not present

## 2019-06-26 DIAGNOSIS — E871 Hypo-osmolality and hyponatremia: Secondary | ICD-10-CM | POA: Diagnosis not present

## 2019-06-27 DIAGNOSIS — E871 Hypo-osmolality and hyponatremia: Secondary | ICD-10-CM | POA: Diagnosis not present

## 2019-06-27 DIAGNOSIS — I5023 Acute on chronic systolic (congestive) heart failure: Secondary | ICD-10-CM | POA: Diagnosis not present

## 2019-06-27 DIAGNOSIS — R0600 Dyspnea, unspecified: Secondary | ICD-10-CM | POA: Diagnosis not present

## 2019-06-27 DIAGNOSIS — I110 Hypertensive heart disease with heart failure: Secondary | ICD-10-CM | POA: Diagnosis not present

## 2019-06-28 DIAGNOSIS — I110 Hypertensive heart disease with heart failure: Secondary | ICD-10-CM | POA: Diagnosis not present

## 2019-06-28 DIAGNOSIS — I5023 Acute on chronic systolic (congestive) heart failure: Secondary | ICD-10-CM | POA: Diagnosis not present

## 2019-06-28 DIAGNOSIS — E871 Hypo-osmolality and hyponatremia: Secondary | ICD-10-CM | POA: Diagnosis not present

## 2019-06-28 DIAGNOSIS — R0600 Dyspnea, unspecified: Secondary | ICD-10-CM | POA: Diagnosis not present

## 2019-06-29 DIAGNOSIS — R0600 Dyspnea, unspecified: Secondary | ICD-10-CM | POA: Diagnosis not present

## 2019-06-29 DIAGNOSIS — I110 Hypertensive heart disease with heart failure: Secondary | ICD-10-CM | POA: Diagnosis not present

## 2019-06-29 DIAGNOSIS — I5023 Acute on chronic systolic (congestive) heart failure: Secondary | ICD-10-CM | POA: Diagnosis not present

## 2019-06-29 DIAGNOSIS — E871 Hypo-osmolality and hyponatremia: Secondary | ICD-10-CM | POA: Diagnosis not present

## 2019-07-24 ENCOUNTER — Encounter (HOSPITAL_COMMUNITY): Payer: Self-pay | Admitting: Emergency Medicine

## 2019-07-24 ENCOUNTER — Emergency Department (HOSPITAL_COMMUNITY): Payer: Medicaid Other

## 2019-07-24 ENCOUNTER — Emergency Department (HOSPITAL_COMMUNITY)
Admission: EM | Admit: 2019-07-24 | Discharge: 2019-07-24 | Disposition: A | Discharge status: Home / Self Care | Payer: Medicaid Other | Attending: Emergency Medicine | Admitting: Emergency Medicine

## 2019-07-24 ENCOUNTER — Other Ambulatory Visit: Payer: Self-pay

## 2019-07-24 DIAGNOSIS — R519 Headache, unspecified: Secondary | ICD-10-CM | POA: Diagnosis present

## 2019-07-24 DIAGNOSIS — Z79899 Other long term (current) drug therapy: Secondary | ICD-10-CM | POA: Insufficient documentation

## 2019-07-24 DIAGNOSIS — Y92410 Unspecified street and highway as the place of occurrence of the external cause: Secondary | ICD-10-CM | POA: Insufficient documentation

## 2019-07-24 DIAGNOSIS — I5022 Chronic systolic (congestive) heart failure: Secondary | ICD-10-CM | POA: Diagnosis not present

## 2019-07-24 DIAGNOSIS — Y999 Unspecified external cause status: Secondary | ICD-10-CM | POA: Insufficient documentation

## 2019-07-24 DIAGNOSIS — R0789 Other chest pain: Secondary | ICD-10-CM | POA: Insufficient documentation

## 2019-07-24 DIAGNOSIS — Z7982 Long term (current) use of aspirin: Secondary | ICD-10-CM | POA: Diagnosis not present

## 2019-07-24 DIAGNOSIS — M542 Cervicalgia: Secondary | ICD-10-CM | POA: Diagnosis not present

## 2019-07-24 DIAGNOSIS — F17210 Nicotine dependence, cigarettes, uncomplicated: Secondary | ICD-10-CM | POA: Diagnosis not present

## 2019-07-24 DIAGNOSIS — I110 Hypertensive heart disease with heart failure: Secondary | ICD-10-CM | POA: Insufficient documentation

## 2019-07-24 DIAGNOSIS — R0781 Pleurodynia: Secondary | ICD-10-CM

## 2019-07-24 LAB — COMPREHENSIVE METABOLIC PANEL
ALT: 12 U/L (ref 0–44)
AST: 19 U/L (ref 15–41)
Albumin: 3.4 g/dL — ABNORMAL LOW (ref 3.5–5.0)
Alkaline Phosphatase: 64 U/L (ref 38–126)
Anion gap: 9 (ref 5–15)
BUN: 20 mg/dL (ref 6–20)
CO2: 18 mmol/L — ABNORMAL LOW (ref 22–32)
Calcium: 8.6 mg/dL — ABNORMAL LOW (ref 8.9–10.3)
Chloride: 110 mmol/L (ref 98–111)
Creatinine, Ser: 0.95 mg/dL (ref 0.61–1.24)
GFR calc Af Amer: >60 mL/min (ref >60)
GFR calc non Af Amer: >60 mL/min (ref >60)
Glucose, Bld: 100 mg/dL — ABNORMAL HIGH (ref 70–99)
Potassium: 4.3 mmol/L (ref 3.5–5.1)
Sodium: 137 mmol/L (ref 135–145)
Total Bilirubin: 0.6 mg/dL (ref 0.3–1.2)
Total Protein: 6 g/dL — ABNORMAL LOW (ref 6.5–8.1)

## 2019-07-24 LAB — URINALYSIS, ROUTINE W REFLEX MICROSCOPIC
Bilirubin Urine: NEGATIVE
Glucose, UA: NEGATIVE mg/dL
Hgb urine dipstick: NEGATIVE
Ketones, ur: NEGATIVE mg/dL
Leukocytes,Ua: NEGATIVE
Nitrite: NEGATIVE
Protein, ur: NEGATIVE mg/dL
Specific Gravity, Urine: 1.025 (ref 1.005–1.030)
pH: 5 (ref 5.0–8.0)

## 2019-07-24 LAB — CBC
HCT: 37.2 % — ABNORMAL LOW (ref 39.0–52.0)
Hemoglobin: 11.8 g/dL — ABNORMAL LOW (ref 13.0–17.0)
MCH: 30.6 pg (ref 26.0–34.0)
MCHC: 31.7 g/dL (ref 30.0–36.0)
MCV: 96.6 fL (ref 80.0–100.0)
Platelets: 257 10*3/uL (ref 150–400)
RBC: 3.85 MIL/uL — ABNORMAL LOW (ref 4.22–5.81)
RDW: 14 % (ref 11.5–15.5)
WBC: 7.1 10*3/uL (ref 4.0–10.5)
nRBC: 0 % (ref 0.0–0.2)

## 2019-07-24 LAB — RAPID URINE DRUG SCREEN, HOSP PERFORMED
Amphetamines: NOT DETECTED
Barbiturates: NOT DETECTED
Benzodiazepines: NOT DETECTED
Cocaine: NOT DETECTED
Opiates: NOT DETECTED
Tetrahydrocannabinol: NOT DETECTED

## 2019-07-24 MED ORDER — NAPROXEN 500 MG PO TABS: 500.0000 mg | ORAL_TABLET | Freq: Two times a day (BID) | ORAL | 0 refills | Status: DC

## 2019-07-24 MED ORDER — KETOROLAC TROMETHAMINE 15 MG/ML IJ SOLN
15.0000 mg | Freq: Once | INTRAMUSCULAR | Status: AC
  Administered 2019-07-24: 15 mg via INTRAVENOUS
  Filled 2019-07-24: qty 1

## 2019-07-24 MED ORDER — METHOCARBAMOL 500 MG PO TABS: 500.0000 mg | ORAL_TABLET | Freq: Two times a day (BID) | ORAL | 0 refills | Status: DC | PRN
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
## 2019-07-24 NOTE — ED Notes (Signed)
Medical Record Req. Sent to [facility]

## 2019-07-24 NOTE — ED Notes (Signed)
Bladder scan; 137ml 

## 2019-07-24 NOTE — Discharge Instructions (Addendum)
Take naproxen 2 times a day with meals.  Do not take other anti-inflammatories at the same time (Advil, Motrin, naproxen, Aleve). You may supplement with Tylenol if you need further pain control. Use robaxin as needed for muscle stiffness or soreness.  Have caution, this may make you tired or groggy.  Do not drive or operate heavy machinery while taking this medicine. Use ice packs or heating pads if this helps control your pain. You will likely have continued muscle stiffness and soreness over the next couple days.  Follow-up with primary care in 1 week if your symptoms are not improving. Return to the emergency room if you develop vision changes, vomiting, slurred speech, numbness, loss of bowel or bladder control, or any new or worsening symptoms.

## 2019-07-24 NOTE — ED Notes (Signed)
Patient transported to X-ray 

## 2019-07-24 NOTE — ED Notes (Signed)
Discharge instructions, including prescription and pain management,discussed with pt. Pt verbalized understanding with no questions at this time. Pt to go home with nephew
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
## 2019-08-28 ENCOUNTER — Emergency Department (HOSPITAL_COMMUNITY): Payer: Medicaid Other

## 2019-08-28 ENCOUNTER — Emergency Department (HOSPITAL_COMMUNITY)
Admission: EM | Admit: 2019-08-28 | Discharge: 2019-08-28 | Disposition: A | Discharge status: Home / Self Care | Payer: Medicaid Other | Attending: Emergency Medicine | Admitting: Emergency Medicine

## 2019-08-28 ENCOUNTER — Encounter (HOSPITAL_COMMUNITY): Payer: Self-pay

## 2019-08-28 ENCOUNTER — Other Ambulatory Visit: Payer: Self-pay

## 2019-08-28 DIAGNOSIS — R05 Cough: Secondary | ICD-10-CM | POA: Insufficient documentation

## 2019-08-28 DIAGNOSIS — I509 Heart failure, unspecified: Secondary | ICD-10-CM | POA: Diagnosis not present

## 2019-08-28 DIAGNOSIS — R1084 Generalized abdominal pain: Secondary | ICD-10-CM | POA: Diagnosis not present

## 2019-08-28 DIAGNOSIS — R0600 Dyspnea, unspecified: Secondary | ICD-10-CM | POA: Insufficient documentation

## 2019-08-28 DIAGNOSIS — Z20828 Contact with and (suspected) exposure to other viral communicable diseases: Secondary | ICD-10-CM | POA: Diagnosis not present

## 2019-08-28 DIAGNOSIS — J449 Chronic obstructive pulmonary disease, unspecified: Secondary | ICD-10-CM | POA: Insufficient documentation

## 2019-08-28 DIAGNOSIS — R079 Chest pain, unspecified: Secondary | ICD-10-CM

## 2019-08-28 DIAGNOSIS — I110 Hypertensive heart disease with heart failure: Secondary | ICD-10-CM | POA: Insufficient documentation

## 2019-08-28 DIAGNOSIS — Z79899 Other long term (current) drug therapy: Secondary | ICD-10-CM | POA: Diagnosis not present

## 2019-08-28 DIAGNOSIS — R109 Unspecified abdominal pain: Secondary | ICD-10-CM

## 2019-08-28 DIAGNOSIS — F17210 Nicotine dependence, cigarettes, uncomplicated: Secondary | ICD-10-CM | POA: Diagnosis not present

## 2019-08-28 DIAGNOSIS — Z7982 Long term (current) use of aspirin: Secondary | ICD-10-CM | POA: Diagnosis not present

## 2019-08-28 DIAGNOSIS — R0789 Other chest pain: Secondary | ICD-10-CM | POA: Insufficient documentation

## 2019-08-28 DIAGNOSIS — I2510 Atherosclerotic heart disease of native coronary artery without angina pectoris: Secondary | ICD-10-CM | POA: Insufficient documentation

## 2019-08-28 LAB — RESPIRATORY PANEL BY RT PCR (FLU A&B, COVID)
Influenza A by PCR: NEGATIVE
Influenza B by PCR: NEGATIVE
SARS Coronavirus 2 by RT PCR: NEGATIVE

## 2019-08-28 LAB — D-DIMER, QUANTITATIVE: D-Dimer, Quant: 6.13 ug/mL-FEU — ABNORMAL HIGH (ref 0.00–0.50)

## 2019-08-28 LAB — URINALYSIS, ROUTINE W REFLEX MICROSCOPIC
Bilirubin Urine: NEGATIVE
Glucose, UA: NEGATIVE mg/dL
Hgb urine dipstick: NEGATIVE
Ketones, ur: NEGATIVE mg/dL
Leukocytes,Ua: NEGATIVE
Nitrite: NEGATIVE
Protein, ur: NEGATIVE mg/dL
Specific Gravity, Urine: 1.024 (ref 1.005–1.030)
pH: 5 (ref 5.0–8.0)

## 2019-08-28 LAB — CBC WITH DIFFERENTIAL/PLATELET
Abs Immature Granulocytes: 0.04 10*3/uL (ref 0.00–0.07)
Basophils Absolute: 0.1 10*3/uL (ref 0.0–0.1)
Basophils Relative: 1 %
Eosinophils Absolute: 0.2 10*3/uL (ref 0.0–0.5)
Eosinophils Relative: 2 %
HCT: 46.2 % (ref 39.0–52.0)
Hemoglobin: 14.1 g/dL (ref 13.0–17.0)
Immature Granulocytes: 0 %
Lymphocytes Relative: 19 %
Lymphs Abs: 2.2 10*3/uL (ref 0.7–4.0)
MCH: 29.1 pg (ref 26.0–34.0)
MCHC: 30.5 g/dL (ref 30.0–36.0)
MCV: 95.5 fL (ref 80.0–100.0)
Monocytes Absolute: 1 10*3/uL (ref 0.1–1.0)
Monocytes Relative: 9 %
Neutro Abs: 8.4 10*3/uL — ABNORMAL HIGH (ref 1.7–7.7)
Neutrophils Relative %: 69 %
Platelets: 315 10*3/uL (ref 150–400)
RBC: 4.84 MIL/uL (ref 4.22–5.81)
RDW: 15.3 % (ref 11.5–15.5)
WBC: 12 10*3/uL — ABNORMAL HIGH (ref 4.0–10.5)
nRBC: 0 % (ref 0.0–0.2)

## 2019-08-28 LAB — POC SARS CORONAVIRUS 2 AG -  ED: SARS Coronavirus 2 Ag: NEGATIVE

## 2019-08-28 LAB — COMPREHENSIVE METABOLIC PANEL
ALT: 20 U/L (ref 0–44)
AST: 27 U/L (ref 15–41)
Albumin: 4.2 g/dL (ref 3.5–5.0)
Alkaline Phosphatase: 78 U/L (ref 38–126)
Anion gap: 11 (ref 5–15)
BUN: 22 mg/dL — ABNORMAL HIGH (ref 6–20)
CO2: 19 mmol/L — ABNORMAL LOW (ref 22–32)
Calcium: 9.2 mg/dL (ref 8.9–10.3)
Chloride: 110 mmol/L (ref 98–111)
Creatinine, Ser: 1.03 mg/dL (ref 0.61–1.24)
GFR calc Af Amer: >60 mL/min (ref >60)
GFR calc non Af Amer: >60 mL/min (ref >60)
Glucose, Bld: 98 mg/dL (ref 70–99)
Potassium: 5.1 mmol/L (ref 3.5–5.1)
Sodium: 140 mmol/L (ref 135–145)
Total Bilirubin: 1.2 mg/dL (ref 0.3–1.2)
Total Protein: 7.5 g/dL (ref 6.5–8.1)

## 2019-08-28 LAB — TROPONIN I (HIGH SENSITIVITY)
Troponin I (High Sensitivity): 15 ng/L (ref <18)
Troponin I (High Sensitivity): 14 ng/L (ref <18)

## 2019-08-28 LAB — LIPASE, BLOOD: Lipase: 22 U/L (ref 11–51)

## 2019-08-28 LAB — BRAIN NATRIURETIC PEPTIDE: B Natriuretic Peptide: 2912.7 pg/mL — ABNORMAL HIGH (ref 0.0–100.0)

## 2019-08-28 MED ORDER — ONDANSETRON HCL 4 MG/2ML IJ SOLN
4.0000 mg | Freq: Once | INTRAMUSCULAR | Status: AC
  Administered 2019-08-28: 15:00:00 4 mg via INTRAVENOUS
  Filled 2019-08-28: qty 2

## 2019-08-28 MED ORDER — HYDROMORPHONE HCL 1 MG/ML IJ SOLN
1.0000 mg | Freq: Once | INTRAMUSCULAR | Status: AC
  Administered 2019-08-28: 1 mg via INTRAVENOUS
  Filled 2019-08-28: qty 1

## 2019-08-28 MED ORDER — LOSARTAN POTASSIUM 25 MG PO TABS: 12.5000 mg | ORAL_TABLET | Freq: Every day | ORAL | 0 refills | Status: DC

## 2019-08-28 MED ORDER — SODIUM CHLORIDE (PF) 0.9 % IJ SOLN
INTRAMUSCULAR | Status: AC
  Filled 2019-08-28: qty 50

## 2019-08-28 MED ORDER — DIGOXIN 125 MCG PO TABS: 0.1250 mg | ORAL_TABLET | Freq: Every day | ORAL | 0 refills | Status: DC

## 2019-08-28 MED ORDER — HYDROMORPHONE HCL 1 MG/ML IJ SOLN
1.0000 mg | Freq: Once | INTRAMUSCULAR | Status: AC
  Administered 2019-08-28: 12:00:00 1 mg via INTRAVENOUS
  Filled 2019-08-28: qty 1

## 2019-08-28 MED ORDER — IOHEXOL 350 MG/ML SOLN
100.0000 mL | Freq: Once | INTRAVENOUS | Status: AC | PRN
  Administered 2019-08-28: 100 mL via INTRAVENOUS

## 2019-08-28 MED ORDER — FUROSEMIDE 10 MG/ML IJ SOLN
40.0000 mg | Freq: Once | INTRAMUSCULAR | Status: AC
  Administered 2019-08-28: 40 mg via INTRAVENOUS
  Filled 2019-08-28: qty 4

## 2019-08-28 MED ORDER — FAMOTIDINE 20 MG PO TABS: 20.0000 mg | ORAL_TABLET | Freq: Two times a day (BID) | ORAL | 0 refills | Status: DC

## 2019-08-28 MED ORDER — ALBUTEROL SULFATE HFA 108 (90 BASE) MCG/ACT IN AERS
2.0000 | INHALATION_SPRAY | Freq: Once | RESPIRATORY_TRACT | Status: AC
  Administered 2019-08-28: 2 via RESPIRATORY_TRACT
  Filled 2019-08-28: qty 6.7

## 2019-08-28 NOTE — Discharge Instructions (Addendum)
Testing today did not show any serious problems.  You do not have a COVID-19 infection.  He did have some fluid in your chest, but should improve by taking Lasix.  Also in your abdomen there is some ascites that will need to be evaluated by your primary care doctor in a week or so.  Additionally for the fluid in the chest, call your cardiologist for a follow-up appointment next week.  Continue taking all of your medications.  Try to eat 3 meals each day.  Make sure that you stay on a low-sodium diet.

## 2019-08-28 NOTE — ED Notes (Signed)
RN attempted to start another IV line for CT scan but was unsuccessful. Melody, RN will attempt with ultrasound.
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
## 2019-08-28 NOTE — ED Triage Notes (Signed)
Patient arrived via EMS c/o chest pain, abd pain, weakness, SOB, and cough. Patient was seen at [facility] on Tuesday and was told there he had a pulled muscle. Patient was not covid tested there because patient did not mention to staff that he had exposure to covid and was having symptoms.   97% RA 103 HR 130/99 CBG 103 97.6

## 2019-08-28 NOTE — ED Notes (Signed)
Urinal at bedside for urine sample.

## 2019-08-28 NOTE — ED Notes (Signed)
IV team at bedside 

## 2019-08-28 NOTE — ED Provider Notes (Addendum)
3:30 PM-checkout from Dr. Verner Chol to evaluate the patient after return of CT images and at that time consider discharge.  These images have been ordered for evaluation of pain in torso and shortness of breath.  He was previously evaluated at an outside ED.  Additionally, today, labs have been ordered.  Clinical Course as of Aug 27 1952  Sat Aug 28, 2019  1744 Normal  Respiratory Panel by RT PCR (Flu A&B, Covid) - Nasopharyngeal Swab [EW]  1744 Normal  Urinalysis, Routine w reflex microscopic [EW]  1744 Normal  Troponin I (High Sensitivity) [EW]  1744 Normal  Lipase, blood [EW]  1744 Normal except white count high  CBC with Differential(!) [EW]  1744 Normal  POC SARS Coronavirus 2 Ag-ED - Nasal Swab (BD Veritor Kit) [EW]  1744 Normal except CO2 low, BUN high  Comprehensive metabolic panel(!) [EW]  8115 Elevated, unchanged from prior  Brain natriuretic peptide(!) [EW]  1745 Elevated   [EW]  1745 According to radiologist, who interpreted the study, the patient has small bilateral pleural effusions, some abdominal ascites, and some edema in the gallbladder wall.   [EW]  1749 Interpreted by radiology, no additional findings, from what was reported on the CT of the chest.  CT ABDOMEN PELVIS W CONTRAST [EW]  1947 After initial discharge, patient informed nursing that he needed refills on some of his medications.   [EW]    Clinical Course User Index [EW] Daleen Bo, MD     Patient Vitals for the past 24 hrs:  BP Temp Temp src Pulse Resp SpO2 Height Weight  08/28/19 1430 (!) 127/107 -- -- (!) 108 (!) 28 97 % -- --  08/28/19 1418 (!) 110/91 -- -- -- (!) 23 97 % -- --  08/28/19 1130 (!) 121/106 -- -- (!) 101 (!) 25 99 % -- --  08/28/19 1111 -- -- -- -- -- 99 % 5' 9"  (1.753 m) 63.5 kg  08/28/19 1101 (!) 119/101 (!) 97.3 F (36.3 C) Oral (!) 103 19 97 % -- --    5:48 PM Reevaluation with update and discussion. After initial assessment and treatment, an updated evaluation reveals  he states that he is thirsty, and hungry.  He feels stronger to go home at this time.  He has urinated about 2 L after being treated with IV Lasix.  He states that sometimes he cannot afford all of his medicines but generally does take his Lasix regularly.  I informed him of the abnormal findings, and the requirement for outpatient follow-up with his cardiologist.  He is agreeable.  All questions were answered. Daleen Bo   Medical Decision Making: Nonspecific pain, and shortness of breath.  Patient with history of congestive heart failure with an EF around 25% last had echocardiogram, 2019.  At this time he is stable for discharge, he diuresed well with a single dose of IV Lasix.  Additional abnormal findings, discussed with the patient that we will require follow-up are small bilateral pleural effusions, gallbladder wall edema, and ascites.  Will recommend to the patient to follow-up with cardiology, and primary care provider.  Doubt ACS, serious bacterial infection or impending vascular collapse.   CRITICAL CARE-no Performed by: Daleen Bo  Nursing Notes Reviewed/ Care Coordinated Applicable Imaging Reviewed Interpretation of Laboratory Data incorporated into ED treatment  The patient appears reasonably screened and/or stabilized for discharge and I doubt any other medical condition or other [facility] requiring further screening, evaluation, or treatment in the ED at this time prior to  discharge.  Plan: Home Medications-continue usual; Home Treatments-low-salt diet; return here if the recommended treatment, does not improve the symptoms; Recommended follow up-cardiology and PCP, next week.    Daleen Bo, MD 08/28/19 [place]     Daleen Bo, MD 08/28/19 [phone]

## 2020-01-24 ENCOUNTER — Other Ambulatory Visit: Payer: Self-pay

## 2020-01-24 ENCOUNTER — Encounter (HOSPITAL_COMMUNITY): Payer: Self-pay

## 2020-01-24 DIAGNOSIS — J449 Chronic obstructive pulmonary disease, unspecified: Secondary | ICD-10-CM | POA: Diagnosis not present

## 2020-01-24 DIAGNOSIS — I5022 Chronic systolic (congestive) heart failure: Secondary | ICD-10-CM | POA: Diagnosis not present

## 2020-01-24 DIAGNOSIS — R1110 Vomiting, unspecified: Secondary | ICD-10-CM | POA: Diagnosis present

## 2020-01-24 DIAGNOSIS — R109 Unspecified abdominal pain: Secondary | ICD-10-CM | POA: Diagnosis not present

## 2020-01-24 DIAGNOSIS — F17210 Nicotine dependence, cigarettes, uncomplicated: Secondary | ICD-10-CM | POA: Insufficient documentation

## 2020-01-24 DIAGNOSIS — I252 Old myocardial infarction: Secondary | ICD-10-CM | POA: Insufficient documentation

## 2020-01-24 DIAGNOSIS — I110 Hypertensive heart disease with heart failure: Secondary | ICD-10-CM | POA: Diagnosis not present

## 2020-01-24 DIAGNOSIS — Z859 Personal history of malignant neoplasm, unspecified: Secondary | ICD-10-CM | POA: Insufficient documentation

## 2020-01-24 DIAGNOSIS — I2510 Atherosclerotic heart disease of native coronary artery without angina pectoris: Secondary | ICD-10-CM | POA: Diagnosis not present

## 2020-01-24 LAB — CBC WITH DIFFERENTIAL/PLATELET
Abs Immature Granulocytes: 0.04 10*3/uL (ref 0.00–0.07)
Basophils Absolute: 0 10*3/uL (ref 0.0–0.1)
Basophils Relative: 0 %
Eosinophils Absolute: 0.1 10*3/uL (ref 0.0–0.5)
Eosinophils Relative: 1 %
HCT: 43.1 % (ref 39.0–52.0)
Hemoglobin: 13.9 g/dL (ref 13.0–17.0)
Immature Granulocytes: 0 %
Lymphocytes Relative: 16 %
Lymphs Abs: 1.5 10*3/uL (ref 0.7–4.0)
MCH: 29.6 pg (ref 26.0–34.0)
MCHC: 32.3 g/dL (ref 30.0–36.0)
MCV: 91.9 fL (ref 80.0–100.0)
Monocytes Absolute: 1.2 10*3/uL — ABNORMAL HIGH (ref 0.1–1.0)
Monocytes Relative: 13 %
Neutro Abs: 6.5 10*3/uL (ref 1.7–7.7)
Neutrophils Relative %: 70 %
Platelets: 380 10*3/uL (ref 150–400)
RBC: 4.69 MIL/uL (ref 4.22–5.81)
RDW: 15.9 % — ABNORMAL HIGH (ref 11.5–15.5)
WBC: 9.4 10*3/uL (ref 4.0–10.5)
nRBC: 0 % (ref 0.0–0.2)

## 2020-01-24 LAB — COMPREHENSIVE METABOLIC PANEL
ALT: 347 U/L — ABNORMAL HIGH (ref 0–44)
AST: 513 U/L — ABNORMAL HIGH (ref 15–41)
Albumin: 3.3 g/dL — ABNORMAL LOW (ref 3.5–5.0)
Alkaline Phosphatase: 146 U/L — ABNORMAL HIGH (ref 38–126)
Anion gap: 9 (ref 5–15)
BUN: 20 mg/dL (ref 6–20)
CO2: 27 mmol/L (ref 22–32)
Calcium: 8.8 mg/dL — ABNORMAL LOW (ref 8.9–10.3)
Chloride: 103 mmol/L (ref 98–111)
Creatinine, Ser: 0.98 mg/dL (ref 0.61–1.24)
GFR calc Af Amer: >60 mL/min (ref >60)
GFR calc non Af Amer: >60 mL/min (ref >60)
Glucose, Bld: 80 mg/dL (ref 70–99)
Potassium: 4.1 mmol/L (ref 3.5–5.1)
Sodium: 139 mmol/L (ref 135–145)
Total Bilirubin: 3 mg/dL — ABNORMAL HIGH (ref 0.3–1.2)
Total Protein: 7.1 g/dL (ref 6.5–8.1)

## 2020-01-24 LAB — URINALYSIS, ROUTINE W REFLEX MICROSCOPIC
Bilirubin Urine: NEGATIVE
Glucose, UA: NEGATIVE mg/dL
Hgb urine dipstick: NEGATIVE
Ketones, ur: NEGATIVE mg/dL
Leukocytes,Ua: NEGATIVE
Nitrite: NEGATIVE
Protein, ur: NEGATIVE mg/dL
Specific Gravity, Urine: 1.021 (ref 1.005–1.030)
pH: 6 (ref 5.0–8.0)

## 2020-01-24 NOTE — ED Triage Notes (Signed)
Hernia on left side of groin for approx 2 months.

## 2020-01-25 ENCOUNTER — Emergency Department (HOSPITAL_COMMUNITY): Payer: Medicaid Other

## 2020-01-25 ENCOUNTER — Encounter (HOSPITAL_COMMUNITY): Payer: Self-pay

## 2020-01-25 ENCOUNTER — Emergency Department (HOSPITAL_COMMUNITY)
Admission: EM | Admit: 2020-01-25 | Discharge: 2020-01-25 | Disposition: A | Discharge status: Home / Self Care | Payer: Medicaid Other | Attending: Emergency Medicine | Admitting: Emergency Medicine

## 2020-01-25 DIAGNOSIS — R109 Unspecified abdominal pain: Secondary | ICD-10-CM

## 2020-01-25 DIAGNOSIS — R112 Nausea with vomiting, unspecified: Secondary | ICD-10-CM

## 2020-01-25 LAB — ACETAMINOPHEN LEVEL: Acetaminophen (Tylenol), Serum: <10 ug/mL — ABNORMAL LOW (ref 10–30)

## 2020-01-25 LAB — LIPASE, BLOOD: Lipase: 37 U/L (ref 11–51)

## 2020-01-25 MED ORDER — PANTOPRAZOLE SODIUM 20 MG PO TBEC
20.0000 mg | DELAYED_RELEASE_TABLET | Freq: Every day | ORAL | 0 refills | Status: AC
Start: 2020-01-25 — End: 2020-02-08

## 2020-01-25 MED ORDER — FENTANYL CITRATE (PF) 100 MCG/2ML IJ SOLN
50.0000 ug | Freq: Once | INTRAMUSCULAR | Status: AC
  Administered 2020-01-25: 50 ug via INTRAVENOUS
  Filled 2020-01-25: qty 2

## 2020-01-25 MED ORDER — ALUM & MAG HYDROXIDE-SIMETH 200-200-20 MG/5ML PO SUSP
30.0000 mL | Freq: Once | ORAL | Status: AC
  Administered 2020-01-25: 30 mL via ORAL
  Filled 2020-01-25: qty 30

## 2020-01-25 MED ORDER — IOHEXOL 300 MG/ML  SOLN
100.0000 mL | Freq: Once | INTRAMUSCULAR | Status: AC | PRN
  Administered 2020-01-25: 100 mL via INTRAVENOUS

## 2020-01-25 MED ORDER — LACTATED RINGERS IV BOLUS
1000.0000 mL | Freq: Once | INTRAVENOUS | Status: AC
  Administered 2020-01-25: 1000 mL via INTRAVENOUS

## 2020-01-25 MED ORDER — FAMOTIDINE IN NACL 20-0.9 MG/50ML-% IV SOLN
20.0000 mg | Freq: Once | INTRAVENOUS | Status: AC
  Administered 2020-01-25: 20 mg via INTRAVENOUS
  Filled 2020-01-25: qty 50

## 2020-01-25 MED ORDER — SODIUM CHLORIDE (PF) 0.9 % IJ SOLN
INTRAMUSCULAR | Status: AC
  Filled 2020-01-25: qty 50

## 2020-01-25 MED ORDER — FAMOTIDINE 20 MG PO TABS
20.0000 mg | ORAL_TABLET | Freq: Two times a day (BID) | ORAL | 0 refills | Status: AC
Start: 2020-01-25 — End: 2020-02-08

## 2020-01-25 MED ORDER — ALUM & MAG HYDROXIDE-SIMETH 400-400-40 MG/5ML PO SUSP: 10.0000 mL | Freq: Four times a day (QID) | ORAL | 0 refills | Status: AC | PRN

## 2020-01-25 MED ORDER — OXYCODONE-ACETAMINOPHEN 5-325 MG PO TABS: 1.0000 | ORAL_TABLET | Freq: Four times a day (QID) | ORAL | 0 refills | Status: AC | PRN

## 2020-01-25 MED ORDER — ONDANSETRON 4 MG PO TBDP: 4.0000 mg | ORAL_TABLET | Freq: Three times a day (TID) | ORAL | 0 refills | Status: AC | PRN

## 2020-01-25 MED ORDER — PANTOPRAZOLE SODIUM 40 MG IV SOLR
40.0000 mg | Freq: Once | INTRAVENOUS | Status: AC
  Administered 2020-01-25: 40 mg via INTRAVENOUS
  Filled 2020-01-25: qty 40
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
## 2020-01-27 LAB — HEPATITIS PANEL, ACUTE
HCV Ab: >11 s/co ratio — ABNORMAL HIGH (ref 0.0–0.9)
Hep A IgM: NEGATIVE — AB
Hep B C IgM: POSITIVE — AB
Hepatitis B Surface Ag: POSITIVE — AB
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
## 2020-05-28 ENCOUNTER — Emergency Department (HOSPITAL_COMMUNITY)
Admission: EM | Admit: 2020-05-28 | Discharge: 2020-05-28 | Discharge status: Against Medical Advice | Payer: Medicaid Other

## 2020-05-28 NOTE — ED Notes (Signed)
Pt stated he did not want to wait to be seen.

## 2022-01-14 ENCOUNTER — Encounter (HOSPITAL_COMMUNITY): Payer: Self-pay | Admitting: Emergency Medicine

## 2022-01-14 ENCOUNTER — Emergency Department (HOSPITAL_COMMUNITY)
Admission: EM | Admit: 2022-01-14 | Discharge: 2022-01-14 | Disposition: A | Discharge status: Home / Self Care | Payer: Medicaid Other | Attending: Emergency Medicine | Admitting: Emergency Medicine

## 2022-01-14 DIAGNOSIS — M545 Low back pain, unspecified: Secondary | ICD-10-CM | POA: Insufficient documentation

## 2022-01-14 DIAGNOSIS — G8929 Other chronic pain: Secondary | ICD-10-CM | POA: Diagnosis not present

## 2022-01-14 MED ORDER — CYCLOBENZAPRINE HCL 10 MG PO TABS: 10.0000 mg | ORAL_TABLET | Freq: Two times a day (BID) | ORAL | 0 refills | Status: AC | PRN

## 2022-01-14 MED ORDER — IBUPROFEN 600 MG PO TABS: 600.0000 mg | ORAL_TABLET | Freq: Four times a day (QID) | ORAL | 0 refills | Status: AC | PRN
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
## 2022-04-16 DEATH — deceased
